# Patient Record
Sex: Female | Born: 1985 | Hispanic: Yes | Marital: Married | Smoking: Never smoker
Health system: Southern US, Community
[De-identification: ages and names within clinical notes are randomized; demographics above are authoritative.]

## PROBLEM LIST (undated history)

## (undated) DIAGNOSIS — S82899A Other fracture of unspecified lower leg, initial encounter for closed fracture: Secondary | ICD-10-CM

## (undated) DIAGNOSIS — N6452 Nipple discharge: Secondary | ICD-10-CM

## (undated) DIAGNOSIS — A749 Chlamydial infection, unspecified: Secondary | ICD-10-CM

## (undated) HISTORY — PX: NO PAST SURGERIES: SHX2092

## (undated) HISTORY — DX: Chlamydial infection, unspecified: A74.9

---

## 2009-03-10 ENCOUNTER — Emergency Department (HOSPITAL_COMMUNITY): Admission: EM | Admit: 2009-03-10 | Discharge: 2009-03-11 | Payer: Self-pay | Admitting: Emergency Medicine

## 2009-08-25 DIAGNOSIS — A749 Chlamydial infection, unspecified: Secondary | ICD-10-CM

## 2009-08-25 HISTORY — DX: Chlamydial infection, unspecified: A74.9

## 2010-12-01 LAB — URINE MICROSCOPIC-ADD ON

## 2010-12-01 LAB — DIFFERENTIAL
Basophils Absolute: 0.1 10*3/uL (ref 0.0–0.1)
Eosinophils Relative: 2 % (ref 0–5)
Lymphocytes Relative: 25 % (ref 12–46)
Lymphs Abs: 1.9 10*3/uL (ref 0.7–4.0)
Neutro Abs: 5 10*3/uL (ref 1.7–7.7)
Neutrophils Relative %: 66 % (ref 43–77)

## 2010-12-01 LAB — URINALYSIS, ROUTINE W REFLEX MICROSCOPIC
Bilirubin Urine: NEGATIVE
Ketones, ur: NEGATIVE mg/dL
Nitrite: NEGATIVE
Urobilinogen, UA: 0.2 mg/dL (ref 0.0–1.0)

## 2010-12-01 LAB — RAPID URINE DRUG SCREEN, HOSP PERFORMED
Barbiturates: NOT DETECTED
Cocaine: NOT DETECTED
Opiates: NOT DETECTED
Tetrahydrocannabinol: NOT DETECTED

## 2010-12-01 LAB — POCT PREGNANCY, URINE: Preg Test, Ur: NEGATIVE

## 2010-12-01 LAB — COMPREHENSIVE METABOLIC PANEL
AST: 27 U/L (ref 0–37)
BUN: 5 mg/dL — ABNORMAL LOW (ref 6–23)
CO2: 26 mEq/L (ref 19–32)
Calcium: 9.8 mg/dL (ref 8.4–10.5)
Chloride: 103 mEq/L (ref 96–112)
Creatinine, Ser: 0.65 mg/dL (ref 0.4–1.2)
GFR calc Af Amer: >60 mL/min (ref >60)
GFR calc non Af Amer: >60 mL/min (ref >60)
Glucose, Bld: 95 mg/dL (ref 70–99)
Total Bilirubin: 0.4 mg/dL (ref 0.3–1.2)

## 2010-12-01 LAB — CBC
HCT: 45.3 % (ref 36.0–46.0)
MCHC: 34.2 g/dL (ref 30.0–36.0)
MCV: 90.3 fL (ref 78.0–100.0)
RBC: 5.02 MIL/uL (ref 3.87–5.11)
WBC: 7.6 10*3/uL (ref 4.0–10.5)

## 2012-08-25 NOTE — L&D Delivery Note (Signed)
Attestation of Attending Supervision of Advanced Practitioner (CNM/NP): Evaluation and management procedures were performed by the Advanced Practitioner under my supervision and collaboration. I have reviewed the Advanced Practitioner's note and chart, and I agree with the management and plan.  Coner Gibbard H. 11:28 AM

## 2012-08-25 NOTE — L&D Delivery Note (Signed)
Delivery Note At 3:56 PM a viable and healthy female was delivered via  (Presentation: LOA;  ).  APGAR: 9, 9; weight pending.   Placenta status: spontaneous, intact .  Cord: 3 vessels with the following complications: none.  Cord pH: pending   Anesthesia: Epidural  Episiotomy: none Lacerations: 2nd degree;Perineal Suture Repair: 3.0 vicryl rapide Est. Blood Loss (mL): 300  Mom to postpartum.  Baby to nursery-stable.  Tawni Carnes 02/25/2013, 4:24 PM

## 2012-09-01 ENCOUNTER — Encounter: Payer: Self-pay | Admitting: *Deleted

## 2012-09-29 ENCOUNTER — Encounter: Payer: Self-pay | Admitting: Obstetrics and Gynecology

## 2012-09-29 ENCOUNTER — Ambulatory Visit (INDEPENDENT_AMBULATORY_CARE_PROVIDER_SITE_OTHER): Payer: Self-pay | Admitting: Obstetrics and Gynecology

## 2012-09-29 VITALS — BP 120/73 | Temp 97.5°F | Ht 63.0 in | Wt 136.0 lb

## 2012-09-29 DIAGNOSIS — Z3480 Encounter for supervision of other normal pregnancy, unspecified trimester: Secondary | ICD-10-CM

## 2012-09-29 DIAGNOSIS — Z3490 Encounter for supervision of normal pregnancy, unspecified, unspecified trimester: Secondary | ICD-10-CM | POA: Insufficient documentation

## 2012-09-29 LAB — POCT URINALYSIS DIP (DEVICE)
Leukocytes, UA: NEGATIVE
Nitrite: NEGATIVE
Protein, ur: NEGATIVE mg/dL
Urobilinogen, UA: 0.2 mg/dL (ref 0.0–1.0)
pH: 6.5 (ref 5.0–8.0)

## 2012-09-29 MED ORDER — MUPIROCIN CALCIUM 2 % EX CREA: TOPICAL_CREAM | Freq: Three times a day (TID) | CUTANEOUS | Status: DC

## 2012-09-29 NOTE — Progress Notes (Signed)
Pulse- 87 

## 2012-09-29 NOTE — Patient Instructions (Addendum)
Por la dolor de los [place], puede usar: acetaminophen 325mg  cada 6 horas. Return in 4 weeks.   Embarazo  Primer trimestre  (Pregnancy - First Trimester)  Durante el acto sexual, millones de espermatozoides entran en la vagina. Slo 1 espermatozoide penetra y fertiliza al vulo mientras se encuentra en la trompa de [place]. Una semana ms tarde, el vulo fertilizado se implanta en la pared del tero. Un embrin comienza a desarrollarse para ser un beb. A las 6 a 8 semanas se forman los ojos y la cara y los latidos del corazn se pueden ver en la ecografa. Al final de las 12 semanas (primer trimestre) todos los rganos del beb estn formados. Ahora que est embarazada, querr hacer todo lo que est a su alcance para tener un beb sano. Dos de las cosas ms importantes son: Winferd Humphrey buena atencin prenatal y seguir las indicaciones del profesional que la asiste. La atencin prenatal incluye toda la asistencia mdica que usted recibe antes del nacimiento del beb. Se lleva a cabo para prevenir y tratar problemas durante el [address] y [place]. EXAMENES PRENATALES   Durante las visitas prenatales se Consolidated Edison, la presin arterial y se solicitan anlisis de [place]. Esto se hace para asegurarse de que usted est sana y el embarazo progrese normalmente.  Una mujer embarazada debe aumentar de 25 a 35 libras durante el embarazo. Sin embargo, si usted tiene sobrepeso o [place], su mdico Administrator, Civil Service.  El podr hacerle preguntas y responder todas las que usted le haga.  Durante los exmenes prenatales se solicitan anlisis de [place], cultivos del [place], un [place] y otros anlisis necesarios. Estas pruebas se realizan para controlar su salud y la del beb. Se recomienda que se haga la prueba para el diagnstico del VIH, con su autorizacin. Este es el virus que causa el [place]. Estas pruebas se realizan porque existen medicamentos que podran administrarle para prevenir que el beb nazca  con esta infeccin, si usted estuviera infectada y no lo supiera. Los [employer] Corporation de sangre tambin se Radiographer, therapeutic para Warehouse manager tipo de [place], si tuvo infecciones previas y Chief Operating Officer sus niveles en la sangre (hemoglobina).  Tener un recuento bajo de hemoglobina (anemia) es comn durante Firefighter. Para prevenirla, se administran hierro y vitaminas. En una etapa ms avanzada del [place], le indicarn exmenes de sangre para saber si tiene diabetes, junto con otros anlisis, en caso de que [place]. Es necesario que se haga las pruebas para asegurarse de que usted y el beb estn bien.  Es posible que necesite otras pruebas adicionales. CAMBIOS DURANTE EL PRIMER TRIMESTRE (LOS TRES PRIMEROS MESES DEL EMBARAZO).  Su organismo atravesar numerosos cambios Academic librarian. Estos pueden variar de Neomia Dear persona a otra. Converse con el mdico acerca los cambios que usted nota y que la preocupan. Ellos son:   El perodo menstrual se detiene.  El vulo y los espermatozoides llevan los genes que determinan cmo seremos. Sus genes y los de su pareja forman el beb. Los genes del varn determinan si ser un nio o una nia.  La circunferencia de la cintura va a ir aumentando y podr sentirse hinchada.  Puede tener Programme researcher, broadcasting/film/video (nuseas) y vmitos. Si no puede controlar los vmitos, consulte a su mdico.  Sus mamas comenzarn a agrandarse y sensibilizarse.  Los pezones pueden sobresalir ms y ser ms oscuros.  Tendr necesidad de orinar ms. El dolor al orinar puede significar que usted tiene una infeccin de la  vejiga.  Se cansar con facilidad.  Prdida del apetito.  Sentir un fuerte deseo de consumir ciertos alimentos.  Al principio, usted puede ganar o perder un par de kilos.  Podr tener cambios emocionales de un da a otro (entusiasmo por estar embarazada o preocupacin por Firefighter y el beb).  Tendr sueos ms vvidos y extraos. INSTRUCCIONES PARA EL CUIDADO EN  EL HOGAR   Es muy importante evitar el cigarrillo, el alcohol y los frmacos no recetados Academic librarian. Estas sustancias afectan la formacin y el desarrollo del beb. Evite los productos qumicos durante el embarazo para Games developer salud del beb.  Comience las consultas prenatales alrededor de la 12 semana de [place]. Generalmente se programan cada mes al principio y se hacen ms frecuentes en los 2 ltimos meses antes del parto. Cumpla con las citas de control. Siga las indicaciones del mdico con respecto al uso de [place], los anlisis y pruebas de [place], los ejercicios y Psychologist, forensic.  Durante el embarazo debe obtener nutrientes para usted y para su beb. Consuma alimentos balanceados. Elija alimentos como carne, pescado, [place] y otros productos lcteos descremados, vegetales, frutas, panes integrales y cereales. El [employer]ot informar cul es el aumento de peso ideal.  Las nuseas matinales pueden aliviarse si come algunas galletitas saladas en la cama. Coma dos galletitas antes de levantarse por la maana. Tambin puede comer galletitas sin sal.  Hacer 4 o 5 comidas pequeas en lugar de 3 comidas grandes por da tambin puede Yahoo nuseas y los vmitos.  Beber lquidos [place] comidas en lugar de tomarlos durante las comidas tambin puede ayudar a Optician, dispensing las nuseas y los vmitos.  Puede continuar teniendo [employer] [place] durante todo el embarazo si no hay otros problemas. Los problemas pueden ser una prdida precoz (prematura) de lquido amnitico, sangrado vaginal, o dolor en el vientre (abdominal).  Realice [employer]oro Corporation, si no tiene restricciones. Consulte con su mdico o terapeuta fsico si no est segura de algunos de sus ejercicios. El mayor aumento de peso se producir en los ltimos 2 trimestres del Psychiatrist. El ejercicio le ayudar a:  Engineering geologist.  Mantenerse en forma.  Prepararse para el parto.  La ayudar a perder el  peso del embarazo despus de que nazca su beb.  Use un buen sostn o como los que se usan para hacer deportes para Paramedic la sensibilidad de las [place]. Tambin puede serle til si lo [place] mientras duerme.  Consulte cuando puede comenzar con las clases de pre parto. Comience con las clases cuando estn disponibles.  No utilice la baera con agua caliente, baos turcos y saunas.  Colquese el cinturn de seguridad cuando conduzca. Este la proteger a usted y al beb en caso de accidente.  Evite comer carne cruda y el contacto con los utensilios y desperdicios de los gatos. Estos elementos contienen grmenes que pueden causar defectos de nacimiento en el beb.  El primer trimestre es un buen momento para visitar a su dentista y Software engineer. Es importante mantener los dientes limpios. Use un cepillo de dientes suave y cepllese con ms suavidad durante el [employer].  Pida ayuda si tienen necesidades financieras, teraputicas o nutricionales. El profesional podr ayudarla con respecto a estas necesidades, o derivarla a otros especialistas.  No tome medicamentos o hierbas excepto aquellos que Fish farm manager.  Informe a su mdico si sufre violencia familiar mental o fsica.  Haga una lista de nmeros de  telfono de emergencia de la familia, los amigos, el hospital y los departamentos de polica y bomberos.  Escriba sus preguntas. Llvelas cuando concurra a su visita prenatal.  No se haga duchas vaginales.  No cruce las piernas.  Si usted tiene que estar parada por largos perodos de [place], gire los pies o de pequeos pasos en crculo.  Es posible que tenga ms secreciones vaginales que puedan requerir una toalla higinica. No use tampones o toallas higinicas perfumadas. EL CONSUMO DE MEDICAMENTOS Y FRMACOS DURANTE EL EMBARAZO   Tome las vitaminas para la etapa prenatal tal como se le indic. Las vitaminas deben contener un miligramo de cido flico. Guarde todas las  vitaminas fuera del alcance de los nios. La ingestin de slo un par de vitaminas o tabletas que contengan hierro pueden ocasionar la [employer] en un beb o en un nio pequeo.  Evite el uso de [employer] Mutual [employer] Omaha, incluyendo hierbas, medicamentos de [place], sin receta o que no hayan sido sugeridos por su mdico. Slo tome medicamentos de venta libre o medicamentos recetados para Chief Technology Officer, Environmental health practitioner o fiebre como lo indique su mdico. No tome aspirina, ibuprofeno o naproxeno excepto que su mdico se lo indique.  Infrmele al profesional si consume medicamentos de hierbas.  El alcohol se relaciona con ciertos defectos congnitos. Incluye el sndrome de alcoholismo fetal. Debe evitar absolutamente el consumo de alcohol, en cualquier forma. El fumar causar baja tasa de natalidad y bebs prematuros.  Las drogas ilegales o de la calle son muy perjudiciales para el beb. Estn absolutamente prohibidas. Un beb que nace de [employer] Express, ser adicto al nacer. Ese beb tendr los mismos sntomas de abstinencia que un adulto.  Informe a su mdico acerca de los medicamentos que ha tomado y el motivo por el que los tom. EL ABORTO ESPONTNEO ES COMN DURANTE EL EMBARAZO  Esto no significa que haya hecho algo mal. No es un motivo para preocuparse en caso de un nuevo embarazo. El profesional que la asiste la ayudar si tiene preguntas para formular. Si tiene un aborto espontneo podr requerir Futures trader.  SOLICITE ATENCIN MDICA SI:  Tiene preguntas o preocupaciones relacionadas con el embarazo. Es mejor que llame para formular las preguntas si no puede esperar hasta la prxima visita, que sentirse preocupada por ellas.  SOLICITE ATENCIN MDICA DE INMEDIATO SI:   La temperatura oral le sube a ms de 102 F (38.9 C) o lo que su mdico le indique.  Tiene una prdida de lquido por la vagina (canal de parto). Si sospecha una ruptura de las [place], tmese la temperatura y llame al  profesional para informarlo sobre esto.  Observa unas pequeas manchas o una hemorragia vaginal. Notifique al profesional acerca de la cantidad y de cuntos apsitos est utilizando.  Presenta un olor desagradable en la secrecin vaginal y observa un cambio en el color.  Contina con las nuseas y no obtiene alivio de los remedios indicados. Vomita sangre o algo similar a la borra del caf.  Pierde ms de 2 libras (1 Kg) en una semana.  Aumenta ms de 1 Kg en una semana y [address], las manos, los pies o las piernas hinchados.  Aumenta ms de 2,5 Kg en una semana (aunque no tenga las manos, pies, piernas o el rostro hinchados).  Ha estado expuesta a la rubola y no ha sufrido la enfermedad.  Ha estado expuesta a la quinta enfermedad o a la varicela.  Siente dolor en el  vientre (abdominal). Las [employer]ul en el ligamento redondo son Neomia Dear causa benigna frecuente de dolor abdominal durante el embarazo. El profesional que la asiste deber evaluarla.  Presenta dolor de Renne Musca, diarrea, dolor al orinar o le falta la respiracin.  Se cae, se ve involucrada en un accidente automovilstico o sufre algn tipo de traumatismo.  En su hogar hay violencia mental o fsica. Document Released: 05/21/2005 Document Revised: 02/10/2012 [facility] Patient Information 2013 [place], [place]. Embarazo - Segundo trimestre (Pregnancy - Second Trimester) El segundo trimestre del Psychiatrist (del 3 al ) es un perodo de evolucin rpida para usted y el beb. Hacia el final del sexto mes, el beb mide aproximadamente 23 cm y pesa 680 g. Comenzar a Pharmacologist del beb [place] 18 y las 20 [address] de [place]. Podr sentir las pataditas ("quickening en ingls"). Hay un rpido [employer]. Puede segregar un lquido claro Charity fundraiser) de las [place]. Quizs sienta pequeas contracciones en el vientre (tero) Esto se conoce como falso trabajo de parto o contracciones de [place]. Es como una  prctica del trabajo de parto que se produce cuando el beb est listo para salir. Generalmente los problemas de vmitos matinales ya se han superado hacia el final del Medical sales representative. Algunas mujeres desarrollan pequeas manchas oscuras (que se denominan cloasma, mscara del embarazo) en la cara que normalmente se van luego del nacimiento del beb. La exposicin al sol empeora las manchas. Puede desarrollarse acn en algunas mujeres embarazadas, y puede desaparecer en aquellas que ya tienen acn. EXAMENES PRENATALES  Durante los [employer]power [employer], deber seguir realizando pruebas de [place], segn avance el [place]. Estas pruebas se realizan para controlar su salud y la del beb. Tambin se realizan anlisis de sangre para The [employer] Mutual niveles de [place]. La anemia (bajo nivel de hemoglobina) es frecuente durante el embarazo. Para prevenirla, se administran hierro y vitaminas. Tambin se le realizarn exmenes para saber si tiene diabetes entre las 24 y las 28 semanas del [place]. Podrn repetirle algunas de las Hovnan[employer] Enterprises hicieron previamente.  En cada visita le medirn el tamao del tero. Esto se realiza para asegurarse de que el beb est creciendo correctamente de acuerdo al estado del [place].  Tambin en cada visita prenatal controlarn su presin arterial. Esto se realiza para asegurarse de que no tenga toxemia.  Se controlar su orina para asegurarse de que no tenga infecciones, diabetes o protena en la orina.  Se controlar su peso regularmente para asegurarse que el aumento ocurre al ritmo indicado. Esto se hace para asegurarse que usted y el beb tienen una evolucin normal.  En algunas ocasiones se realiza una prueba de ultrasonido para confirmar el correcto desarrollo y evolucin del beb. Esta prueba se realiza con ondas sonoras inofensivas para el beb, de modo que el profesional pueda calcular ms precisamente la fecha del [place]. Algunas veces se realizan pruebas  especializadas del lquido amnitico que rodea al beb. Esta prueba se denomina amniocentesis. El lquido amnitico se obtiene introduciendo una aguja en el vientre (abdomen). Se realiza para Conservator, museum/gallery en los que existe alguna preocupacin acerca de algn problema gentico que pueda sufrir el beb. En ocasiones se lleva a cabo cerca del final del embarazo, si es necesario inducir al Apple Computer. En este caso se realiza para asegurarse que los pulmones del beb estn lo suficientemente maduros como para que pueda vivir fuera del tero. CAMBIOS QUE OCURREN EN EL SEGUNDO TRIMESTRE DEL EMBARAZO Su organismo atravesar numerosos cambios  durante el embarazo. Estos pueden variar de Neomia Dear persona a otra. Converse con el profesional que la asiste acerca los cambios que usted note y que la preocupen.  Durante el segundo trimestre probablemente sienta un aumento del apetito. Es normal tener "antojos" de Development worker, community. Esto vara de Neomia Dear persona a otra y de un embarazo a Therapist, art.  El abdomen inferior comenzar a abultarse.  Podr tener la necesidad de Geographical information systems officer con ms frecuencia debido a que el tero y el beb presionan sobre la vejiga. Tambin es frecuente contraer ms infecciones urinarias durante el embarazo (dolor al Conoco[employer]illips). Puede evitarlas bebiendo gran cantidad de lquidos y vaciando la vejiga antes y despus de Sales promotion account executive.  Podrn aparecer las primeras estras en las caderas, abdomen y [place]. Estos son cambios normales del cuerpo durante el [place]. No existen medicamentos ni ejercicios que puedan prevenir [employer]Max.  Es posible que comience a desarrollar venas inflamadas y abultadas (varices) en las piernas. El uso de medias de descanso, Optometrist sus pies durante 15 minutos, 3 a 4 veces al da y Film/video editor la sal en su dieta ayuda a Journalist, newspaper.  Podr sentir Engineering geologist gstrica a medida que el tero crece y Doctor, general practice. Puede tomar anticidos,  con la autorizacin de su mdico, para Financial planner. Tambin es til ingerir pequeas comidas 4 a 5 veces al Futures trader.  La constipacin puede tratarse con un laxante o agregando fibra a su dieta. Beber grandes cantidades de lquidos, comer vegetales, frutas y granos integrales es de [place].  Tambin es beneficioso practicar actividad fsica. Si ha sido una persona Engineer, mining, podr continuar con la Harley[employer]son de las actividades durante el mismo. Si ha sido [employer] Family Insurance, puede ser beneficioso que comience con un programa de ejercicios, Museum/gallery exhibitions officer.  Puede desarrollar hemorroides (vrices en el recto) hacia el final del segundo trimestre. Tomar baos de asiento tibios y Chemical engineer cremas recomendadas por el profesional que lo asiste sern de ayuda para los problemas de hemorroides.  Tambin podr Financial risk analyst de espalda durante este momento de su embarazo. Evite levantar objetos pesados, utilice zapatos de taco bajo y [place] buena postura para ayudar a reducir los problemas de [place].  Algunas mujeres embarazadas desarrollan hormigueo y adormecimiento de la mano y los dedos debido a la hinchazn y compresin de los ligamentos de la mueca (sndrome del tnel carpiano). Esto desaparece una vez que el beb nace.  Como sus pechos se agrandan, Pension scheme manager un sujetador ms grande. Use un sostn de soporte, cmodo y de algodn. No utilice un sostn para amamantar hasta el ltimo mes de embarazo si va a amamantar al beb.  Podr observar una lnea oscura desde el ombligo hacia la zona pbica denominada linea nigra.  Podr observar que sus mejillas se ponen coloradas debido al aumento de flujo sanguneo en la cara.  Podr desarrollar "araitas" en la cara, cuello y pecho. Esto desaparece una vez que el beb nace. INSTRUCCIONES PARA EL CUIDADO DOMICILIARIO  Es extremadamente importante que evite el cigarrillo, hierbas medicinales, alcohol y las drogas no prescriptas  durante el Psychiatrist. Estas sustancias qumicas afectan la formacin y el desarrollo del beb. Evite estas sustancias durante todo el embarazo para asegurar el nacimiento de un beb sano.  La mayor parte de los cuidados que se aconsejan son los mismos que los indicados para Financial risk analyst trimestre del Psychiatrist. Cumpla con las citas tal como se le indic. Siga las instrucciones del profesional que lo  asiste con respecto al [employer], el ejercicio y la dieta.  Durante el embarazo debe obtener nutrientes para usted y para su beb. Consuma alimentos balanceados a intervalos regulares. Elija alimentos como carne, pescado, [place] y otros productos lcteos descremados, vegetales, frutas, panes integrales y cereales. El Equities trader cul es el aumento de peso ideal.  Las relaciones sexuales fsicas pueden continuarse hasta cerca del fin del embarazo si no existen otros problemas. Estos problemas pueden ser la prdida temprana (prematura) de lquido amnitico de las [place], sangrado vaginal, dolor abdominal u otros problemas mdicos o del Psychiatrist.  Realice Tesoro Corporation, si no tiene restricciones. Consulte con el profesional que la asiste si no sabe con certeza si determinados ejercicios son seguros. El mayor aumento de peso tiene Environmental consultant durante los ltimos 2 trimestres del Psychiatrist. El ejercicio la ayudar a:  Engineering geologist.  Ponerla en forma para el parto.  Ayudarla a perder peso luego de haber dado a luz.  Use un buen sostn o como los que se usan para hacer deportes para Paramedic la sensibilidad de las [place]. Tambin puede serle til si lo [place] mientras duerme. Si pierde Product manager, podr Parker Hann[employer]in.  No utilice la baera con agua caliente, baos turcos y saunas durante el [address]  Utilice el cinturn de seguridad sin excepcin cuando conduzca. Este la proteger a usted y al beb en caso de accidente.  Evite comer carne cruda, queso  crudo, y el contacto con los utensilios y desperdicios de los gatos. Estos elementos contienen grmenes que pueden causar defectos de nacimiento en el beb.  El segundo trimestre es un buen momento para visitar a su dentista y Software engineer si an no lo ha hecho. Es Primary school teacher los dientes limpios. Utilice un cepillo de dientes blando. Cepllese ms suavemente durante el embarazo.  Es ms fcil perder algo de orina durante el [place]. Apretar y Chief Operating Officer los msculos de la pelvis la ayudar con este problema. Practique detener la miccin cuando est en el bao. Estos son los mismos msculos que Development worker, international aid. Son [employer] Partners mismos msculos que utiliza cuando trata de Ryder System gases. Puede practicar apretando estos msculos 10 veces, y repetir esto 3 veces por da aproximadamente. Una vez que conozca qu msculos debe apretar, no realice estos ejercicios durante la miccin. Puede favorecerle una infeccin si la orina vuelve hacia atrs.  Pida ayuda si tiene necesidades econmicas, de asesoramiento o nutricionales durante el [place]. El profesional podr ayudarla con respecto a estas necesidades, o derivarla a otros especialistas.  La piel puede ponerse grasa. Si esto sucede, lvese la cara con un jabn [place], utilice un humectante no graso y [place] con base de aceite o crema. CONSUMO DE MEDICAMENTOS Y DROGAS DURANTE EL EMBARAZO  Contine tomando las vitaminas apropiadas para esta etapa tal como se le indic. Las vitaminas deben contener un miligramo de cido flico y deben suplementarse con hierro. Guarde todas las vitaminas fuera del alcance de los nios. La ingestin de slo un par de vitaminas o tabletas que contengan hierro puede ocasionar la [employer]mont Mining en un beb o en un nio pequeo.  Evite el uso de [place], inclusive los de venta libre y hierbas que no hayan sido prescriptos o indicados por el profesional que la asiste. Algunos medicamentos pueden causar  problemas fsicos al beb. Utilice los medicamentos de venta libre o de prescripcin para el dolor, el malestar o la fiebre, segn se lo indique  el profesional que lo asiste. No utilice aspirina.  El consumo de alcohol est relacionado con ciertos defectos de nacimiento. Esto incluye el sndrome de alcoholismo fetal. Debe evitar el consumo de alcohol en cualquiera de sus formas. El cigarrillo causa nacimientos prematuros y bebs de [place]. El uso de drogas recreativas est absolutamente prohibido. Son muy nocivas para el beb. Un beb que nace de American Express, ser adicto al nacer. Ese beb tendr los mismos sntomas de abstinencia que un adulto.  Infrmele al profesional si consume alguna droga.  No consuma drogas ilegales. Pueden causarle mucho dao al beb. SOLICITE ATENCIN MDICA SI: Tiene preguntas o preocupaciones durante su embarazo. Es mejor que llame para Science writer las dudas que esperar hasta su prxima visita prenatal. Thressa Sheller forma se sentir ms tranquila.  SOLICITE ATENCIN MDICA DE INMEDIATO SI:  La temperatura oral se eleva sin motivo por encima de 102 F (38.9 C) o segn le indique el profesional que lo asiste.  Tiene una prdida de lquido por la vagina (canal de parto). Si sospecha una ruptura de las [place], tmese la temperatura y llame al profesional para informarlo sobre esto.  Observa unas pequeas manchas, una hemorragia vaginal o elimina cogulos. Notifique al profesional acerca de la cantidad y de cuntos apsitos est utilizando. Unas pequeas manchas de sangre son algo comn durante el Psychiatrist, especialmente despus de Sales promotion account executive.  Presenta un olor desagradable en la secrecin vaginal y observa un cambio en el color, de transparente a blanco.  Contina con las nuseas y no obtiene alivio de los remedios indicados. Vomita sangre o algo similar a la borra del caf.  [place] o sube ms de 900 g. en una semana, o segn lo indicado por el  profesional que la asiste.  Observa que se le Southwest Airlines, las manos, los pies o las piernas.  Ha estado expuesta a la rubola y no ha sufrido la enfermedad.  Ha estado expuesta a la quinta enfermedad o a la varicela.  Presenta dolor abdominal. Las molestias en el ligamento redondo son Neomia Dear causa no cancerosa (benigna) frecuente de dolor abdominal durante el embarazo. El profesional que la asiste deber evaluarla.  Presenta dolor de cabeza intenso que no se [place].  Presenta fiebre, diarrea, dolor al orinar o le falta la respiracin.  Presenta dificultad para ver, visin borrosa, o visin doble.  Sufre una cada, un accidente de trnsito o cualquier tipo de trauma.  Vive en un hogar en el que existe violencia fsica o mental. Document Released: 05/21/2005 Document Revised: 11/03/2011 [facility] Patient Information 2013 [place], [place].

## 2012-09-29 NOTE — Progress Notes (Signed)
1st pregnancy and initial OB visit.  G1P0 Doing well.  Did eat raw oysters and had subsequent diarrhea and diffuse abdominal pain.  This is now resolved.  Patient also reports pain in her hands and feet.   Denies vaginal bleeding and loss of fluid. Does report some intermittent diffuse abdominal pain.

## 2012-09-29 NOTE — Progress Notes (Signed)
  Subjective:    Amber Thomas is being seen today for her first obstetrical visit.  This is a planned pregnancy. She is at [redacted]w[redacted]d gestation. Her obstetrical history is significant for no risk factors. Relationship with FOB: spouse, living together. Patient does intend to breast feed. Pregnancy history fully reviewed.  Patient reports: diarrhea and diffuse abdominal pain after eating raw oysters a few days ago. Now resolved. Reporting some pain in hands and feet. Has not tried anything for it. She also reports some abdominal discomfort, worst at night, feels like tightening in the upper abdomen. Resolves during the day. No nausea, no vomiting. She also reports some itchy skin around pubic bone associated with rash. She also reports itching on back and abdomen, without a rash. No vaginal bleeding, no abnormal discharge or loss of fluid.   Review of Systems:   Review of Systems NEgative except per HPI Objective:     BP 120/73  Temp 97.5 F (36.4 C)  Ht 5\' 3"  (1.6 m)  Wt 136 lb (61.689 kg)  BMI 24.09 kg/m2  LMP 05/25/2012 Physical Exam  Exam Physical Examination: General appearance - alert, well appearing, and in no distress Chest - clear to auscultation, no wheezes, rales or rhonchi, symmetric air entry Heart - normal rate, regular rhythm, normal S1, S2, no murmurs, rubs, clicks or gallops Abdomen - gravid, non tender Pelvic - normal external genitalia, vulva and vagina. Friable cervix with pap smear.  Cervix: closed and thick.  Skin - erythematous papules around shaved pubic hair.    Assessment:    Pregnancy: 27 yo G1P0 at 18.1wga who presents for initial OB visit.  Patient Active Problem List  Diagnosis  . Supervision of normal pregnancy       Plan:     - Initial labs drawn. Anatomy US ordered - No risk factors for early glucola (no first degree relative h/o DM2, normal BMI) - Prenatal vitamins. - folliculitis: instructed to stop shaving. Will also send  mupirocin. - abdominal discomfort: likely from food poisoning. Cervix closed.  - abdominal and back itching: unlikely to be from cholestasis in pregnancy. Follow up at next visit.  Problem list reviewed and updated. - Quad screen: offered and drawn today.  Follow up in 4 weeks.   Amber Thomas 09/29/2012

## 2012-09-29 NOTE — Progress Notes (Signed)
Evaluation and management procedures were performed by Resident physician under my supervision/collaboration. Chart reviewed, patient examined by me and I agree with management and plan.  

## 2012-09-30 LAB — OBSTETRIC PANEL
Eosinophils Absolute: 0.1 10*3/uL (ref 0.0–0.7)
Hemoglobin: 12.3 g/dL (ref 12.0–15.0)
Hepatitis B Surface Ag: NEGATIVE
Lymphocytes Relative: 18 % (ref 12–46)
Lymphs Abs: 1.9 10*3/uL (ref 0.7–4.0)
MCH: 29.9 pg (ref 26.0–34.0)
Monocytes Relative: 4 % (ref 3–12)
Neutrophils Relative %: 76 % (ref 43–77)
Platelets: 302 10*3/uL (ref 150–400)
RBC: 4.12 MIL/uL (ref 3.87–5.11)
Rh Type: POSITIVE
Rubella: 8.1 Index — ABNORMAL HIGH (ref <0.90)
WBC: 10.2 10*3/uL (ref 4.0–10.5)

## 2012-10-02 LAB — CULTURE, OB URINE: Colony Count: >=100000

## 2012-10-04 ENCOUNTER — Telehealth: Payer: Self-pay | Admitting: *Deleted

## 2012-10-04 DIAGNOSIS — N390 Urinary tract infection, site not specified: Secondary | ICD-10-CM

## 2012-10-04 MED ORDER — CEPHALEXIN 500 MG PO CAPS: 500.0000 mg | ORAL_CAPSULE | Freq: Two times a day (BID) | ORAL | Status: DC

## 2012-10-04 NOTE — Telephone Encounter (Signed)
Message copied by Gerome Apley on Mon Oct 04, 2012  4:40 PM ------      Message from: POE, DEIRDRE C      Created: Mon Oct 04, 2012  1:03 PM       Please tx keflex 500 bid if not already done ------

## 2012-10-04 NOTE — Telephone Encounter (Signed)
Called patient with ana for interpreter and informed patient of UTI and that a medication had been sent to her [facility] pharmacy and is ready for pickup. Patient verbalized understanding and had no further questions

## 2012-10-05 ENCOUNTER — Other Ambulatory Visit: Payer: Self-pay | Admitting: *Deleted

## 2012-10-05 ENCOUNTER — Telehealth: Payer: Self-pay | Admitting: *Deleted

## 2012-10-05 DIAGNOSIS — N390 Urinary tract infection, site not specified: Secondary | ICD-10-CM

## 2012-10-05 MED ORDER — CEPHALEXIN 500 MG PO CAPS: 500.0000 mg | ORAL_CAPSULE | Freq: Two times a day (BID) | ORAL | Status: DC

## 2012-10-05 NOTE — Telephone Encounter (Signed)
Pt called with questions about medications which were ordered. [employer] interpreter was assisting with pt call. Pt stated that she was told she would have antibiotic pills ordered but when she went to the pharmacy, the only Rx they had on file was for a cream. I explained to pt that her antibiotic Rx will be re-sent to the pharmacy and she can pick it up later today. (Rx had been printed and not given to pt)  I also informed her that she needs the cream to apply to the areas of infection (folliculitis) from shaving. This was discussed at the time of her visit on 09/29/12. Pt also asked for her lab results from visit on 09/29/12. I stated that all other results were normal (pt already aware of UTI). Pt voiced understanding and had no further questions.

## 2012-10-06 ENCOUNTER — Encounter: Payer: Self-pay | Admitting: Family

## 2012-10-18 ENCOUNTER — Encounter (HOSPITAL_COMMUNITY): Payer: Self-pay

## 2012-10-18 ENCOUNTER — Inpatient Hospital Stay (HOSPITAL_COMMUNITY)
Admission: AD | Admit: 2012-10-18 | Discharge: 2012-10-18 | Disposition: A | Discharge status: Home / Self Care | Payer: Self-pay | Source: Ambulatory Visit | Attending: Obstetrics & Gynecology | Admitting: Obstetrics & Gynecology

## 2012-10-18 DIAGNOSIS — O368190 Decreased fetal movements, unspecified trimester, not applicable or unspecified: Secondary | ICD-10-CM | POA: Insufficient documentation

## 2012-10-18 NOTE — MAU Note (Signed)
Has had one prenatal visit in our OB clinic and is scheduled for u/s on3/5/14; denies any pain or bleeding; concerned about feeling less fetal movement today; G1; 20 weeks and 6 days gestation;

## 2012-10-18 NOTE — MAU Provider Note (Signed)
Chief Complaint: Decreased Fetal Movement    SUBJECTIVE HPI: Amber Thomas is a 27 y.o. G1P0 at [redacted]w[redacted]d by LMP who presents with concern of not feeling FM today though she had been feeling it daily. While here, began to perceive movement. PN course: LRC, uncomplicated  Past Medical History  Diagnosis Date  . Medical history non-contributory    OB History   Grav Para Term Preterm Abortions TAB SAB Ect Mult Living   1              # Outc Date GA Lbr Len/2nd Wgt Sex Del Anes PTL Lv   1 CUR              Past Surgical History  Procedure Laterality Date  . No past surgeries     History   Social History  . Marital Status: Single    Spouse Name: N/A    Number of Children: N/A  . Years of Education: N/A   Occupational History  . Not on file.   Social History Main Topics  . Smoking status: Never Smoker   . Smokeless tobacco: Not on file  . Alcohol Use: No  . Drug Use: No  . Sexually Active: Not on file   Other Topics Concern  . Not on file   Social History Narrative  . No narrative on file   No current facility-administered medications on file prior to encounter.   No current outpatient prescriptions on file prior to encounter.   No Known Allergies  ROS: Pertinent items in HPI  OBJECTIVE Blood pressure 123/69, pulse 85, temperature 98.4 F (36.9 C), temperature source Oral, resp. rate 16, height 5\' 5"  (1.651 m), weight 62.234 kg (137 lb 3.2 oz), SpO2 100.00%. GENERAL: Well-developed, well-nourished female in no acute distress.  HEENT: Normocephalic HEART: normal rate RESP: normal effort ABDOMEN: Soft, non-tender, DT FHR 150 EXTREMITIES: Nontender, no edema NEURO: Alert and oriented ASSESSMENT 1. Decreased fetal movement in pregnancy, delivered, second trimester, fetus 1   G1 at [redacted]w[redacted]d   PLAN Discharge home with reassurance. See AVS Follow-up Information   Follow up with [facility] On 10/27/2012.       Medication List     TAKE these medications       prenatal multivitamin Tabs  Take 1 tablet by mouth daily.         Danae Orleans, CNM 10/18/2012  12:45 PM

## 2012-10-18 NOTE — MAU Note (Signed)
Patient states she has felt fetal movement since last week. Has only one visit to the [facility]. Has had loose stool for one week. No pain or bleeding.

## 2012-10-21 ENCOUNTER — Encounter (HOSPITAL_COMMUNITY): Payer: Self-pay

## 2012-10-24 ENCOUNTER — Encounter: Payer: Self-pay | Admitting: Family

## 2012-10-27 ENCOUNTER — Ambulatory Visit (INDEPENDENT_AMBULATORY_CARE_PROVIDER_SITE_OTHER): Payer: Self-pay | Admitting: Obstetrics and Gynecology

## 2012-10-27 ENCOUNTER — Encounter: Payer: Self-pay | Admitting: Obstetrics and Gynecology

## 2012-10-27 VITALS — BP 97/65 | Temp 97.3°F | Wt 138.0 lb

## 2012-10-27 DIAGNOSIS — Z3400 Encounter for supervision of normal first pregnancy, unspecified trimester: Secondary | ICD-10-CM

## 2012-10-27 DIAGNOSIS — Z3402 Encounter for supervision of normal first pregnancy, second trimester: Secondary | ICD-10-CM

## 2012-10-27 NOTE — Patient Instructions (Signed)
Embarazo - Segundo trimestre (Pregnancy - Second Trimester) El segundo trimestre del embarazo (del 3 al 6mes) es un perodo de evolucin rpida para usted y el beb. Hacia el final del sexto mes, el beb mide aproximadamente 23 cm y pesa 680 g. Comenzar a sentir los movimientos del beb entre las 18 y las 20 semanas de embarazo. Podr sentir las pataditas ("quickening en ingls"). Hay un rpido aumento de peso. Puede segregar un lquido claro (calostro) de las mamas. Quizs sienta pequeas contracciones en el vientre (tero) Esto se conoce como falso trabajo de parto o contracciones de Braxton-Hicks. Es como una prctica del trabajo de parto que se produce cuando el beb est listo para salir. Generalmente los problemas de vmitos matinales ya se han superado hacia el final del primer trimestre. Algunas mujeres desarrollan pequeas manchas oscuras (que se denominan cloasma, mscara del embarazo) en la cara que normalmente se van luego del nacimiento del beb. La exposicin al sol empeora las manchas. Puede desarrollarse acn en algunas mujeres embarazadas, y puede desaparecer en aquellas que ya tienen acn. EXAMENES PRENATALES  Durante los exmenes prenatales, deber seguir realizando pruebas de sangre, segn avance el embarazo. Estas pruebas se realizan para controlar su salud y la del beb. Tambin se realizan anlisis de sangre para conocer los niveles de hemoglobina. La anemia (bajo nivel de hemoglobina) es frecuente durante el embarazo. Para prevenirla, se administran hierro y vitaminas. Tambin se le realizarn exmenes para saber si tiene diabetes entre las 24 y las 28 semanas del embarazo. Podrn repetirle algunas de las pruebas que le hicieron previamente.  En cada visita le medirn el tamao del tero. Esto se realiza para asegurarse de que el beb est creciendo correctamente de acuerdo al estado del embarazo.  Tambin en cada visita prenatal controlarn su presin arterial. Esto se realiza  para asegurarse de que no tenga toxemia.  Se controlar su orina para asegurarse de que no tenga infecciones, diabetes o protena en la orina.  Se controlar su peso regularmente para asegurarse que el aumento ocurre al ritmo indicado. Esto se hace para asegurarse que usted y el beb tienen una evolucin normal.  En algunas ocasiones se realiza una prueba de ultrasonido para confirmar el correcto desarrollo y evolucin del beb. Esta prueba se realiza con ondas sonoras inofensivas para el beb, de modo que el profesional pueda calcular ms precisamente la fecha del parto. Algunas veces se realizan pruebas especializadas del lquido amnitico que rodea al beb. Esta prueba se denomina amniocentesis. El lquido amnitico se obtiene introduciendo una aguja en el vientre (abdomen). Se realiza para controlar los cromosomas en aquellos casos en los que existe alguna preocupacin acerca de algn problema gentico que pueda sufrir el beb. En ocasiones se lleva a cabo cerca del final del embarazo, si es necesario inducir al parto. En este caso se realiza para asegurarse que los pulmones del beb estn lo suficientemente maduros como para que pueda vivir fuera del tero. CAMBIOS QUE OCURREN EN EL SEGUNDO TRIMESTRE DEL EMBARAZO Su organismo atravesar numerosos cambios durante el embarazo. Estos pueden variar de una persona a otra. Converse con el profesional que la asiste acerca los cambios que usted note y que la preocupen.  Durante el segundo trimestre probablemente sienta un aumento del apetito. Es normal tener "antojos" de ciertas comidas. Esto vara de una persona a otra y de un embarazo a otro.  El abdomen inferior comenzar a abultarse.  Podr tener la necesidad de orinar con ms frecuencia debido a que   el tero y el beb presionan sobre la vejiga. Tambin es frecuente contraer ms infecciones urinarias durante el embarazo (dolor al orinar). Puede evitarlas bebiendo gran cantidad de lquidos y vaciando  la vejiga antes y despus de mantener relaciones sexuales.  Podrn aparecer las primeras estras en las caderas, abdomen y mamas. Estos son cambios normales del cuerpo durante el embarazo. No existen medicamentos ni ejercicios que puedan prevenir estos cambios.  Es posible que comience a desarrollar venas inflamadas y abultadas (varices) en las piernas. El uso de medias de descanso, elevar sus pies durante 15 minutos, 3 a 4 veces al da y limitar la sal en su dieta ayuda a aliviar el problema.  Podr sentir acidez gstrica a medida que el tero crece y presiona contra el estmago. Puede tomar anticidos, con la autorizacin de su mdico, para aliviar este problema. Tambin es til ingerir pequeas comidas 4 a 5 veces al da.  La constipacin puede tratarse con un laxante o agregando fibra a su dieta. Beber grandes cantidades de lquidos, comer vegetales, frutas y granos integrales es de gran ayuda.  Tambin es beneficioso practicar actividad fsica. Si ha sido una persona activa hasta el embarazo, podr continuar con la mayora de las actividades durante el mismo. Si ha sido menos activa, puede ser beneficioso que comience con un programa de ejercicios, como realizar caminatas.  Puede desarrollar hemorroides (vrices en el recto) hacia el final del segundo trimestre. Tomar baos de asiento tibios y utilizar cremas recomendadas por el profesional que lo asiste sern de ayuda para los problemas de hemorroides.  Tambin podr sentir dolor de espalda durante este momento de su embarazo. Evite levantar objetos pesados, utilice zapatos de taco bajo y mantenga una buena postura para ayudar a reducir los problemas de espalda.  Algunas mujeres embarazadas desarrollan hormigueo y adormecimiento de la mano y los dedos debido a la hinchazn y compresin de los ligamentos de la mueca (sndrome del tnel carpiano). Esto desaparece una vez que el beb nace.  Como sus pechos se agrandan, necesitar un sujetador  ms grande. Use un sostn de soporte, cmodo y de algodn. No utilice un sostn para amamantar hasta el ltimo mes de embarazo si va a amamantar al beb.  Podr observar una lnea oscura desde el ombligo hacia la zona pbica denominada linea nigra.  Podr observar que sus mejillas se ponen coloradas debido al aumento de flujo sanguneo en la cara.  Podr desarrollar "araitas" en la cara, cuello y pecho. Esto desaparece una vez que el beb nace. INSTRUCCIONES PARA EL CUIDADO DOMICILIARIO  Es extremadamente importante que evite el cigarrillo, hierbas medicinales, alcohol y las drogas no prescriptas durante el embarazo. Estas sustancias qumicas afectan la formacin y el desarrollo del beb. Evite estas sustancias durante todo el embarazo para asegurar el nacimiento de un beb sano.  La mayor parte de los cuidados que se aconsejan son los mismos que los indicados para el primer trimestre del embarazo. Cumpla con las citas tal como se le indic. Siga las instrucciones del profesional que lo asiste con respecto al uso de los medicamentos, el ejercicio y la dieta.  Durante el embarazo debe obtener nutrientes para usted y para su beb. Consuma alimentos balanceados a intervalos regulares. Elija alimentos como carne, pescado, leche y otros productos lcteos descremados, vegetales, frutas, panes integrales y cereales. El profesional le informar cul es el aumento de peso ideal.  Las relaciones sexuales fsicas pueden continuarse hasta cerca del fin del embarazo si no existen otros problemas. Estos   problemas pueden ser la prdida temprana (prematura) de lquido amnitico de las membranas, sangrado vaginal, dolor abdominal u otros problemas mdicos o del embarazo.  Realice actividad fsica todos los das, si no tiene restricciones. Consulte con el profesional que la asiste si no sabe con certeza si determinados ejercicios son seguros. El mayor aumento de peso tiene lugar durante los ltimos 2 trimestres del  embarazo. El ejercicio la ayudar a:  Controlar su peso.  Ponerla en forma para el parto.  Ayudarla a perder peso luego de haber dado a luz.  Use un buen sostn o como los que se usan para hacer deportes para aliviar la sensibilidad de las mamas. Tambin puede serle til si lo usa mientras duerme. Si pierde calostro, podr utilizar apsitos en el sostn.  No utilice la baera con agua caliente, baos turcos y saunas durante el embarazo.  Utilice el cinturn de seguridad sin excepcin cuando conduzca. Este la proteger a usted y al beb en caso de accidente.  Evite comer carne cruda, queso crudo, y el contacto con los utensilios y desperdicios de los gatos. Estos elementos contienen grmenes que pueden causar defectos de nacimiento en el beb.  El segundo trimestre es un buen momento para visitar a su dentista y evaluar su salud dental si an no lo ha hecho. Es importante mantener los dientes limpios. Utilice un cepillo de dientes blando. Cepllese ms suavemente durante el embarazo.  Es ms fcil perder algo de orina durante el embarazo. Apretar y fortalecer los msculos de la pelvis la ayudar con este problema. Practique detener la miccin cuando est en el bao. Estos son los mismos msculos que necesita fortalecer. Son tambin los mismos msculos que utiliza cuando trata de evitar los gases. Puede practicar apretando estos msculos 10 veces, y repetir esto 3 veces por da aproximadamente. Una vez que conozca qu msculos debe apretar, no realice estos ejercicios durante la miccin. Puede favorecerle una infeccin si la orina vuelve hacia atrs.  Pida ayuda si tiene necesidades econmicas, de asesoramiento o nutricionales durante el embarazo. El profesional podr ayudarla con respecto a estas necesidades, o derivarla a otros especialistas.  La piel puede ponerse grasa. Si esto sucede, lvese la cara con un jabn suave, utilice un humectante no graso y maquillaje con base de aceite o  crema. CONSUMO DE MEDICAMENTOS Y DROGAS DURANTE EL EMBARAZO  Contine tomando las vitaminas apropiadas para esta etapa tal como se le indic. Las vitaminas deben contener un miligramo de cido flico y deben suplementarse con hierro. Guarde todas las vitaminas fuera del alcance de los nios. La ingestin de slo un par de vitaminas o tabletas que contengan hierro puede ocasionar la muerte en un beb o en un nio pequeo.  Evite el uso de medicamentos, inclusive los de venta libre y hierbas que no hayan sido prescriptos o indicados por el profesional que la asiste. Algunos medicamentos pueden causar problemas fsicos al beb. Utilice los medicamentos de venta libre o de prescripcin para el dolor, el malestar o la fiebre, segn se lo indique el profesional que lo asiste. No utilice aspirina.  El consumo de alcohol est relacionado con ciertos defectos de nacimiento. Esto incluye el sndrome de alcoholismo fetal. Debe evitar el consumo de alcohol en cualquiera de sus formas. El cigarrillo causa nacimientos prematuros y bebs de bajo peso. El uso de drogas recreativas est absolutamente prohibido. Son muy nocivas para el beb. Un beb que nace de una madre adicta, ser adicto al nacer. Ese beb tendr los mismos   sntomas de abstinencia que un adulto.  Infrmele al profesional si consume alguna droga.  No consuma drogas ilegales. Pueden causarle mucho dao al beb. SOLICITE ATENCIN MDICA SI: Tiene preguntas o preocupaciones durante su embarazo. Es mejor que llame para consultar las dudas que esperar hasta su prxima visita prenatal. De esta forma se sentir ms tranquila.  SOLICITE ATENCIN MDICA DE INMEDIATO SI:  La temperatura oral se eleva sin motivo por encima de 102 F (38.9 C) o segn le indique el profesional que lo asiste.  Tiene una prdida de lquido por la vagina (canal de parto). Si sospecha una ruptura de las membranas, tmese la temperatura y llame al profesional para informarlo sobre  esto.  Observa unas pequeas manchas, una hemorragia vaginal o elimina cogulos. Notifique al profesional acerca de la cantidad y de cuntos apsitos est utilizando. Unas pequeas manchas de sangre son algo comn durante el embarazo, especialmente despus de mantener relaciones sexuales.  Presenta un olor desagradable en la secrecin vaginal y observa un cambio en el color, de transparente a blanco.  Contina con las nuseas y no obtiene alivio de los remedios indicados. Vomita sangre o algo similar a la borra del caf.  [place] o sube ms de 900 g. en una semana, o segn lo indicado por el profesional que la asiste.  Observa que se le hinchan el rostro, las manos, los pies o las piernas.  Ha estado expuesta a la rubola y no ha sufrido la enfermedad.  Ha estado expuesta a la quinta enfermedad o a la varicela.  Presenta dolor abdominal. Las molestias en el ligamento redondo son una causa no cancerosa (benigna) frecuente de dolor abdominal durante el embarazo. El profesional que la asiste deber evaluarla.  Presenta dolor de cabeza intenso que no se alivia.  Presenta fiebre, diarrea, dolor al orinar o le falta la respiracin.  Presenta dificultad para ver, visin borrosa, o visin doble.  Sufre una cada, un accidente de trnsito o cualquier tipo de trauma.  Vive en un hogar en el que existe violencia fsica o mental. Document Released: 05/21/2005 Document Revised: 11/03/2011 [employer] Patient Information 2013 [employer].  

## 2012-10-27 NOTE — Progress Notes (Signed)
Pulse: 88

## 2012-10-27 NOTE — Progress Notes (Signed)
Doing well. Has not had anatomic scan that was scheduled for this am> to [place]. ASB was treated.

## 2012-11-01 ENCOUNTER — Ambulatory Visit (HOSPITAL_COMMUNITY)
Admission: RE | Admit: 2012-11-01 | Discharge: 2012-11-01 | Disposition: A | Discharge status: Home / Self Care | Payer: Self-pay | Source: Ambulatory Visit | Attending: Family | Admitting: Family

## 2012-11-01 DIAGNOSIS — Z363 Encounter for antenatal screening for malformations: Secondary | ICD-10-CM | POA: Insufficient documentation

## 2012-11-01 DIAGNOSIS — Z1389 Encounter for screening for other disorder: Secondary | ICD-10-CM | POA: Insufficient documentation

## 2012-11-01 DIAGNOSIS — Z3490 Encounter for supervision of normal pregnancy, unspecified, unspecified trimester: Secondary | ICD-10-CM

## 2012-11-01 DIAGNOSIS — O358XX0 Maternal care for other (suspected) fetal abnormality and damage, not applicable or unspecified: Secondary | ICD-10-CM | POA: Insufficient documentation

## 2012-11-02 ENCOUNTER — Telehealth: Payer: Self-pay | Admitting: *Deleted

## 2012-11-02 DIAGNOSIS — B379 Candidiasis, unspecified: Secondary | ICD-10-CM

## 2012-11-02 MED ORDER — FLUCONAZOLE 150 MG PO TABS: 150.0000 mg | ORAL_TABLET | Freq: Once | ORAL | Status: DC

## 2012-11-02 NOTE — Telephone Encounter (Signed)
Patient called and stated that she has a white discharge with itching. Per OB Protocol Diflucan ordered and sent to her pharmacy. Pt voiced understanding.

## 2012-11-04 ENCOUNTER — Encounter: Payer: Self-pay | Admitting: Family

## 2012-11-24 ENCOUNTER — Ambulatory Visit (INDEPENDENT_AMBULATORY_CARE_PROVIDER_SITE_OTHER): Payer: Self-pay | Admitting: Obstetrics and Gynecology

## 2012-11-24 ENCOUNTER — Other Ambulatory Visit: Payer: Self-pay | Admitting: Obstetrics and Gynecology

## 2012-11-24 VITALS — BP 111/71 | Temp 98.6°F | Wt 143.2 lb

## 2012-11-24 DIAGNOSIS — Z3400 Encounter for supervision of normal first pregnancy, unspecified trimester: Secondary | ICD-10-CM

## 2012-11-24 DIAGNOSIS — Z3402 Encounter for supervision of normal first pregnancy, second trimester: Secondary | ICD-10-CM

## 2012-11-24 LAB — CBC
HCT: 34.3 % — ABNORMAL LOW (ref 36.0–46.0)
Hemoglobin: 11.9 g/dL — ABNORMAL LOW (ref 12.0–15.0)
MCHC: 34.7 g/dL (ref 30.0–36.0)
WBC: 11.1 10*3/uL — ABNORMAL HIGH (ref 4.0–10.5)

## 2012-11-24 LAB — POCT URINALYSIS DIP (DEVICE)
Bilirubin Urine: NEGATIVE
Glucose, UA: NEGATIVE mg/dL
Hgb urine dipstick: NEGATIVE
Ketones, ur: NEGATIVE mg/dL
Specific Gravity, Urine: 1.015 (ref 1.005–1.030)
Urobilinogen, UA: 0.2 mg/dL (ref 0.0–1.0)

## 2012-11-24 NOTE — Addendum Note (Signed)
Addended by: Franchot Mimes on: 11/24/2012 03:40 PM   Modules accepted: Orders

## 2012-11-24 NOTE — Progress Notes (Signed)
Doing well. 1 hr glucola today. Anatomic [place] all normal but spine and outflow tracts not well visualized>F/U scheduled.

## 2012-11-24 NOTE — Patient Instructions (Signed)
Embarazo - Segundo trimestre (Pregnancy - Second Trimester) El segundo trimestre del embarazo (del 3 al 6mes) es un perodo de evolucin rpida para usted y el beb. Hacia el final del sexto mes, el beb mide aproximadamente 23 cm y pesa 680 g. Comenzar a sentir los movimientos del beb entre las 18 y las 20 semanas de embarazo. Podr sentir las pataditas ("quickening en ingls"). Hay un rpido aumento de peso. Puede segregar un lquido claro (calostro) de las mamas. Quizs sienta pequeas contracciones en el vientre (tero) Esto se conoce como falso trabajo de parto o contracciones de Braxton-Hicks. Es como una prctica del trabajo de parto que se produce cuando el beb est listo para salir. Generalmente los problemas de vmitos matinales ya se han superado hacia el final del primer trimestre. Algunas mujeres desarrollan pequeas manchas oscuras (que se denominan cloasma, mscara del embarazo) en la cara que normalmente se van luego del nacimiento del beb. La exposicin al sol empeora las manchas. Puede desarrollarse acn en algunas mujeres embarazadas, y puede desaparecer en aquellas que ya tienen acn. EXAMENES PRENATALES  Durante los exmenes prenatales, deber seguir realizando pruebas de sangre, segn avance el embarazo. Estas pruebas se realizan para controlar su salud y la del beb. Tambin se realizan anlisis de sangre para conocer los niveles de hemoglobina. La anemia (bajo nivel de hemoglobina) es frecuente durante el embarazo. Para prevenirla, se administran hierro y vitaminas. Tambin se le realizarn exmenes para saber si tiene diabetes entre las 24 y las 28 semanas del embarazo. Podrn repetirle algunas de las pruebas que le hicieron previamente.  En cada visita le medirn el tamao del tero. Esto se realiza para asegurarse de que el beb est creciendo correctamente de acuerdo al estado del embarazo.  Tambin en cada visita prenatal controlarn su presin arterial. Esto se realiza  para asegurarse de que no tenga toxemia.  Se controlar su orina para asegurarse de que no tenga infecciones, diabetes o protena en la orina.  Se controlar su peso regularmente para asegurarse que el aumento ocurre al ritmo indicado. Esto se hace para asegurarse que usted y el beb tienen una evolucin normal.  En algunas ocasiones se realiza una prueba de ultrasonido para confirmar el correcto desarrollo y evolucin del beb. Esta prueba se realiza con ondas sonoras inofensivas para el beb, de modo que el profesional pueda calcular ms precisamente la fecha del parto. Algunas veces se realizan pruebas especializadas del lquido amnitico que rodea al beb. Esta prueba se denomina amniocentesis. El lquido amnitico se obtiene introduciendo una aguja en el vientre (abdomen). Se realiza para controlar los cromosomas en aquellos casos en los que existe alguna preocupacin acerca de algn problema gentico que pueda sufrir el beb. En ocasiones se lleva a cabo cerca del final del embarazo, si es necesario inducir al parto. En este caso se realiza para asegurarse que los pulmones del beb estn lo suficientemente maduros como para que pueda vivir fuera del tero. CAMBIOS QUE OCURREN EN EL SEGUNDO TRIMESTRE DEL EMBARAZO Su organismo atravesar numerosos cambios durante el embarazo. Estos pueden variar de una persona a otra. Converse con el profesional que la asiste acerca los cambios que usted note y que la preocupen.  Durante el segundo trimestre probablemente sienta un aumento del apetito. Es normal tener "antojos" de ciertas comidas. Esto vara de una persona a otra y de un embarazo a otro.  El abdomen inferior comenzar a abultarse.  Podr tener la necesidad de orinar con ms frecuencia debido a que   el tero y el beb presionan sobre la vejiga. Tambin es frecuente contraer ms infecciones urinarias durante el embarazo (dolor al orinar). Puede evitarlas bebiendo gran cantidad de lquidos y vaciando  la vejiga antes y despus de mantener relaciones sexuales.  Podrn aparecer las primeras estras en las caderas, abdomen y mamas. Estos son cambios normales del cuerpo durante el embarazo. No existen medicamentos ni ejercicios que puedan prevenir estos cambios.  Es posible que comience a desarrollar venas inflamadas y abultadas (varices) en las piernas. El uso de medias de descanso, elevar sus pies durante 15 minutos, 3 a 4 veces al da y limitar la sal en su dieta ayuda a aliviar el problema.  Podr sentir acidez gstrica a medida que el tero crece y presiona contra el estmago. Puede tomar anticidos, con la autorizacin de su mdico, para aliviar este problema. Tambin es til ingerir pequeas comidas 4 a 5 veces al da.  La constipacin puede tratarse con un laxante o agregando fibra a su dieta. Beber grandes cantidades de lquidos, comer vegetales, frutas y granos integrales es de gran ayuda.  Tambin es beneficioso practicar actividad fsica. Si ha sido una persona activa hasta el embarazo, podr continuar con la mayora de las actividades durante el mismo. Si ha sido menos activa, puede ser beneficioso que comience con un programa de ejercicios, como realizar caminatas.  Puede desarrollar hemorroides (vrices en el recto) hacia el final del segundo trimestre. Tomar baos de asiento tibios y utilizar cremas recomendadas por el profesional que lo asiste sern de ayuda para los problemas de hemorroides.  Tambin podr sentir dolor de espalda durante este momento de su embarazo. Evite levantar objetos pesados, utilice zapatos de taco bajo y mantenga una buena postura para ayudar a reducir los problemas de espalda.  Algunas mujeres embarazadas desarrollan hormigueo y adormecimiento de la mano y los dedos debido a la hinchazn y compresin de los ligamentos de la mueca (sndrome del tnel carpiano). Esto desaparece una vez que el beb nace.  Como sus pechos se agrandan, necesitar un sujetador  ms grande. Use un sostn de soporte, cmodo y de algodn. No utilice un sostn para amamantar hasta el ltimo mes de embarazo si va a amamantar al beb.  Podr observar una lnea oscura desde el ombligo hacia la zona pbica denominada linea nigra.  Podr observar que sus mejillas se ponen coloradas debido al aumento de flujo sanguneo en la cara.  Podr desarrollar "araitas" en la cara, cuello y pecho. Esto desaparece una vez que el beb nace. INSTRUCCIONES PARA EL CUIDADO DOMICILIARIO  Es extremadamente importante que evite el cigarrillo, hierbas medicinales, alcohol y las drogas no prescriptas durante el embarazo. Estas sustancias qumicas afectan la formacin y el desarrollo del beb. Evite estas sustancias durante todo el embarazo para asegurar el nacimiento de un beb sano.  La mayor parte de los cuidados que se aconsejan son los mismos que los indicados para el primer trimestre del embarazo. Cumpla con las citas tal como se le indic. Siga las instrucciones del profesional que lo asiste con respecto al uso de los medicamentos, el ejercicio y la dieta.  Durante el embarazo debe obtener nutrientes para usted y para su beb. Consuma alimentos balanceados a intervalos regulares. Elija alimentos como carne, pescado, leche y otros productos lcteos descremados, vegetales, frutas, panes integrales y cereales. El profesional le informar cul es el aumento de peso ideal.  Las relaciones sexuales fsicas pueden continuarse hasta cerca del fin del embarazo si no existen otros problemas. Estos   problemas pueden ser la prdida temprana (prematura) de lquido amnitico de las membranas, sangrado vaginal, dolor abdominal u otros problemas mdicos o del embarazo.  Realice actividad fsica todos los das, si no tiene restricciones. Consulte con el profesional que la asiste si no sabe con certeza si determinados ejercicios son seguros. El mayor aumento de peso tiene lugar durante los ltimos 2 trimestres del  embarazo. El ejercicio la ayudar a:  Controlar su peso.  Ponerla en forma para el parto.  Ayudarla a perder peso luego de haber dado a luz.  Use un buen sostn o como los que se usan para hacer deportes para aliviar la sensibilidad de las mamas. Tambin puede serle til si lo usa mientras duerme. Si pierde calostro, podr utilizar apsitos en el sostn.  No utilice la baera con agua caliente, baos turcos y saunas durante el embarazo.  Utilice el cinturn de seguridad sin excepcin cuando conduzca. Este la proteger a usted y al beb en caso de accidente.  Evite comer carne cruda, queso crudo, y el contacto con los utensilios y desperdicios de los gatos. Estos elementos contienen grmenes que pueden causar defectos de nacimiento en el beb.  El segundo trimestre es un buen momento para visitar a su dentista y evaluar su salud dental si an no lo ha hecho. Es importante mantener los dientes limpios. Utilice un cepillo de dientes blando. Cepllese ms suavemente durante el embarazo.  Es ms fcil perder algo de orina durante el embarazo. Apretar y fortalecer los msculos de la pelvis la ayudar con este problema. Practique detener la miccin cuando est en el bao. Estos son los mismos msculos que necesita fortalecer. Son tambin los mismos msculos que utiliza cuando trata de evitar los gases. Puede practicar apretando estos msculos 10 veces, y repetir esto 3 veces por da aproximadamente. Una vez que conozca qu msculos debe apretar, no realice estos ejercicios durante la miccin. Puede favorecerle una infeccin si la orina vuelve hacia atrs.  Pida ayuda si tiene necesidades econmicas, de asesoramiento o nutricionales durante el embarazo. El profesional podr ayudarla con respecto a estas necesidades, o derivarla a otros especialistas.  La piel puede ponerse grasa. Si esto sucede, lvese la cara con un jabn suave, utilice un humectante no graso y maquillaje con base de aceite o  crema. CONSUMO DE MEDICAMENTOS Y DROGAS DURANTE EL EMBARAZO  Contine tomando las vitaminas apropiadas para esta etapa tal como se le indic. Las vitaminas deben contener un miligramo de cido flico y deben suplementarse con hierro. Guarde todas las vitaminas fuera del alcance de los nios. La ingestin de slo un par de vitaminas o tabletas que contengan hierro puede ocasionar la muerte en un beb o en un nio pequeo.  Evite el uso de medicamentos, inclusive los de venta libre y hierbas que no hayan sido prescriptos o indicados por el profesional que la asiste. Algunos medicamentos pueden causar problemas fsicos al beb. Utilice los medicamentos de venta libre o de prescripcin para el dolor, el malestar o la fiebre, segn se lo indique el profesional que lo asiste. No utilice aspirina.  El consumo de alcohol est relacionado con ciertos defectos de nacimiento. Esto incluye el sndrome de alcoholismo fetal. Debe evitar el consumo de alcohol en cualquiera de sus formas. El cigarrillo causa nacimientos prematuros y bebs de bajo peso. El uso de drogas recreativas est absolutamente prohibido. Son muy nocivas para el beb. Un beb que nace de una madre adicta, ser adicto al nacer. Ese beb tendr los mismos   sntomas de abstinencia que un adulto.  Infrmele al profesional si consume alguna droga.  No consuma drogas ilegales. Pueden causarle mucho dao al beb. SOLICITE ATENCIN MDICA SI: Tiene preguntas o preocupaciones durante su embarazo. Es mejor que llame para consultar las dudas que esperar hasta su prxima visita prenatal. De esta forma se sentir ms tranquila.  SOLICITE ATENCIN MDICA DE INMEDIATO SI:  La temperatura oral se eleva sin motivo por encima de 102 F (38.9 C) o segn le indique el profesional que lo asiste.  Tiene una prdida de lquido por la vagina (canal de parto). Si sospecha una ruptura de las membranas, tmese la temperatura y llame al profesional para informarlo sobre  esto.  Observa unas pequeas manchas, una hemorragia vaginal o elimina cogulos. Notifique al profesional acerca de la cantidad y de cuntos apsitos est utilizando. Unas pequeas manchas de sangre son algo comn durante el embarazo, especialmente despus de mantener relaciones sexuales.  Presenta un olor desagradable en la secrecin vaginal y observa un cambio en el color, de transparente a blanco.  Contina con las nuseas y no obtiene alivio de los remedios indicados. Vomita sangre o algo similar a la borra del caf.  [place] o sube ms de 900 g. en una semana, o segn lo indicado por el profesional que la asiste.  Observa que se le hinchan el rostro, las manos, los pies o las piernas.  Ha estado expuesta a la rubola y no ha sufrido la enfermedad.  Ha estado expuesta a la quinta enfermedad o a la varicela.  Presenta dolor abdominal. Las molestias en el ligamento redondo son una causa no cancerosa (benigna) frecuente de dolor abdominal durante el embarazo. El profesional que la asiste deber evaluarla.  Presenta dolor de cabeza intenso que no se alivia.  Presenta fiebre, diarrea, dolor al orinar o le falta la respiracin.  Presenta dificultad para ver, visin borrosa, o visin doble.  Sufre una cada, un accidente de trnsito o cualquier tipo de trauma.  Vive en un hogar en el que existe violencia fsica o mental. Document Released: 05/21/2005 Document Revised: 11/03/2011 [employer] Patient Information 2013 [employer].  

## 2012-11-24 NOTE — Progress Notes (Signed)
Pulse- 98 

## 2012-11-25 LAB — RPR

## 2012-11-25 LAB — HIV ANTIBODY (ROUTINE TESTING W REFLEX): HIV: NONREACTIVE

## 2012-11-25 LAB — GLUCOSE TOLERANCE, 1 HOUR (50G) W/O FASTING: Glucose, 1 Hour GTT: 97 mg/dL (ref 70–140)

## 2012-12-01 ENCOUNTER — Ambulatory Visit (HOSPITAL_COMMUNITY)
Admission: RE | Admit: 2012-12-01 | Discharge: 2012-12-01 | Disposition: A | Discharge status: Home / Self Care | Payer: Self-pay | Source: Ambulatory Visit | Attending: Obstetrics and Gynecology | Admitting: Obstetrics and Gynecology

## 2012-12-01 DIAGNOSIS — Z3402 Encounter for supervision of normal first pregnancy, second trimester: Secondary | ICD-10-CM

## 2012-12-01 DIAGNOSIS — Z3689 Encounter for other specified antenatal screening: Secondary | ICD-10-CM | POA: Insufficient documentation

## 2012-12-15 ENCOUNTER — Ambulatory Visit (INDEPENDENT_AMBULATORY_CARE_PROVIDER_SITE_OTHER): Payer: Self-pay | Admitting: Advanced Practice Midwife

## 2012-12-15 ENCOUNTER — Other Ambulatory Visit: Payer: Self-pay | Admitting: Obstetrics & Gynecology

## 2012-12-15 ENCOUNTER — Encounter: Payer: Self-pay | Admitting: Advanced Practice Midwife

## 2012-12-15 VITALS — BP 116/75 | Temp 97.0°F | Wt 147.6 lb

## 2012-12-15 DIAGNOSIS — Z3493 Encounter for supervision of normal pregnancy, unspecified, third trimester: Secondary | ICD-10-CM

## 2012-12-15 DIAGNOSIS — Z3480 Encounter for supervision of other normal pregnancy, unspecified trimester: Secondary | ICD-10-CM

## 2012-12-15 LAB — POCT URINALYSIS DIP (DEVICE)
Bilirubin Urine: NEGATIVE
Glucose, UA: NEGATIVE mg/dL
Nitrite: NEGATIVE
Specific Gravity, Urine: 1.015 (ref 1.005–1.030)
Urobilinogen, UA: 0.2 mg/dL (ref 0.0–1.0)

## 2012-12-15 NOTE — Progress Notes (Signed)
I saw and examined patient along with student and agree with above note.   Amber Thomas 12/15/2012 10:41 AM

## 2012-12-15 NOTE — Progress Notes (Signed)
Doing well. Complains of small amount of white discharge with no irritation or odor. 1 hr glucola normal. Repeat US with difficult to visualize outflow tract due to fetal position. Advised that light exercise is acceptable during pregnancy. Signs of labor reviewed. Will see in f/u in 2 weeks.

## 2012-12-15 NOTE — Patient Instructions (Addendum)
Embarazo  Tercer trimestre  (Pregnancy - Third Trimester) El tercer trimestre del embarazo (los ltimos 3 meses) es el perodo en el cual tanto usted como su beb crecen con ms rapidez. El beb alcanza un largo de aproximadamente 50 cm. y pesa entre 2,700 y 4,500 kg. El beb gana ms tejido graso y est listo para la vida fuera del cuerpo de la madre. Mientras estn en el interior, los bebs tienen perodos de sueo y vigilia, succionan el pulgar y tienen hipo. Quizs sienta pequeas contracciones del tero. Este es el falso trabajo de parto. Tambin se las conoce como contracciones de Braxton-Hicks . Es como una prctica del parto. Los problemas ms habituales de esta etapa del embarazo incluyen mayor dificultad para respirar, hinchazn de las manos y los pies por retencin de lquidos y la necesidad de orinar con ms frecuencia debido a que el tero y el beb presionan sobre la vejiga.  EXAMENES PRENATALES   Durante los exmenes prenatales, deber seguir realizndose anlisis de sangre. Estas pruebas se realizan para controlar su salud y la del beb. Los anlisis de sangre se realizan para conocer los niveles de algunos compuestos de la sangre (hemoglobina). La anemia (bajo nivel de hemoglobina) es frecuente durante el embarazo. Para prevenirla, se administran hierro y vitaminas. Tambin le tomarn nuevas anlisis para descartar diabetes. Podrn repetirle algunas de las pruebas que le hicieron previamente.  En cada visita le medirn el tamao del tero. Esto permite asegurar que el beb se desarrolla adecuadamente, segn la fecha del embarazo.  Le controlarn la presin arterial en cada visita prenatal. Esto es para asegurarse de que no sufre toxemia.  Le harn un anlisis de orina en cada visita prenatal, para descartar infecciones, diabetes y la presencia de protenas.  Tambin en cada visita controlarn su peso. Esto se realiza para asegurarse que aumenta de peso al ritmo indicado y que usted y su  beb evolucionan normalmente.  En algunas ocasiones se realiza una prueba de ultrasonido para confirmar el correcto desarrollo y evolucin del beb. Esta prueba se realiza con ondas sonoras inofensivas para el beb, de modo que el profesional pueda calcular ms precisamente la fecha del parto.  Analice con su mdico los analgsicos y la anestesia que recibir durante el trabajo de parto y el parto.  Comente la posibilidad de que necesite una cesrea y qu anestesia se recibir.  Informe a su mdico si sufre violencia familiar mental o fsica. A veces, se indica la prueba especializada sin estrs, la prueba de tolerancia a las contracciones y el perfil biofsico para asegurarse de que el beb no tiene problemas. El estudio del lquido amnitico que rodea al beb se llama amniocentesis. El lquido amnitico se obtiene introduciendo una aguja en el vientre (abdomen ). En ocasiones se lleva a cabo cerca del final del embarazo, si es necesario inducir a un parto. En este caso se realiza para asegurarse que los pulmones del beb estn lo suficientemente maduros como para que pueda vivir fuera del tero. Si los pulmones no han madurado y es peligroso que el beb nazca, se administrar a la madre una inyeccin de cortisona , 1 a 2 das antes del parto. . Esto ayuda a que los pulmones del beb maduren y sea ms seguro su nacimiento.  CAMBIOS QUE OCURREN EN EL TERCER TRIMESTRE DEL EMBARAZO  Su organismo atravesar numerosos cambios durante el embarazo. Estos pueden variar de una persona a otra. Converse con el profesional que la asiste acerca los cambios que   usted note y que la preocupen.   Durante el ltimo trimestre probablemente sienta un aumento del apetito. Es normal tener "antojos" de ciertas comidas. Esto vara de una persona a otra y de un embarazo a otro.  Podrn aparecer las primeras estras en las caderas, abdomen y mamas. Estos son cambios normales del cuerpo durante el embarazo. No existen  medicamentos ni ejercicios que puedan prevenir estos cambios.  La constipacin puede tratarse con un laxante o agregando fibra a su dieta. Beber grandes cantidades de lquidos, tomar fibras en forma de vegetales, frutas y granos integrales es de gran ayuda.  Tambin es beneficioso practicar actividad fsica. Si ha sido una persona activa hasta el embarazo, podr continuar con la mayora de las actividades durante el mismo. Si ha sido menos activa, puede ser beneficioso que comience con un programa de ejercicios, como realizar caminatas. Consulte con el profesional que la asiste antes de comenzar un programa de ejercicios.  Evite el consumo de cigarrillos, el alcohol, los medicamentos no recetados y las "drogas de la calle" durante el embarazo. Estas sustancias qumicas afectan la formacin y el desarrollo del beb. Evite estas sustancias durante todo el embarazo para asegurar el nacimiento de un beb sano.  Podr sentir dolor de espalda, tener vrices en las venas y hemorroides, o si ya los sufra, pueden empeorar.  Durante el tercer trimestre se cansar con ms facilidad, lo cual es normal.  Los movimientos del beb pueden ser ms fuertes y con ms frecuencia.  Puede que note dificultades para respirar normalmente.  El ombligo puede salir hacia afuera.  A veces sale una secrecin amarilla de las mamas, que se llama calostro.  Podr aparecer una secrecin mucosa con sangre. Esto suele ocurrir entre unos pocos das y una semana antes del parto. INSTRUCCIONES PARA EL CUIDADO EN EL HOGAR   Cumpla con las citas de control. Siga las indicaciones del mdico con respecto al uso de medicamentos, los ejercicios y la dieta.  Durante el embarazo debe obtener nutrientes para usted y para su beb. Consuma alimentos balanceados a intervalos regulares. Elija alimentos como carne, pescado, leche y otros productos lcteos descremados, vegetales, frutas, panes integrales y cereales. El mdico le informar  cul es el aumento de peso ideal.  Las relaciones sexuales pueden continuarse hasta casi el final del embarazo, si no se presentan otros problemas como prdida prematura (antes de tiempo) de lquido amnitico, hemorragia vaginal o dolor en el vientre (abdominal).  Realice actividad fsica todos los das, si no tiene restricciones. Consulte con el profesional que la asiste si no sabe con certeza si determinados ejercicios son seguros. El mayor aumento de peso se producir en los ltimos 2 trimestres del embarazo. El ejercicio ayuda a:  Controlar su peso.  Mantenerse en forma para el trabajo de parto y el parto .  Perder peso despus del parto.  Haga reposo con frecuencia, con las piernas elevadas, o segn lo necesite para evitar los calambres y el dolor de cintura.  Use un buen sostn o como los que se usan para hacer deportes para aliviar la sensibilidad de las mamas. Tambin puede serle til si lo usa mientras duerme. Si pierde calostro, podr utilizar apsitos en el sostn.  No utilice la baera con agua caliente, baos turcos y saunas.  Colquese el cinturn de seguridad cuando conduzca. Este la proteger a usted y al beb en caso de accidente.  Evite comer carne cruda y el contacto con los utensilios y desperdicios de los gatos. Estos elementos   contienen grmenes que pueden causar defectos de nacimiento en el beb.  Es fcil perder algo de orina durante el embarazo. Apretar y fortalecer los msculos de la pelvis la ayudar con este problema. Practique detener la miccin cuando est en el bao. Estos son los mismos msculos que necesita fortalecer. Son tambin los mismos msculos que utiliza cuando trata de evitar despedir gases. Puede practicar apretando estos msculos diez veces, y repetir esto tres veces por da aproximadamente. Una vez que conozca qu msculos debe apretar, no realice estos ejercicios durante la miccin. Puede favorecerle una infeccin si la orina vuelve hacia  atrs.  Pida ayuda si tienen necesidades financieras, teraputicas o nutricionales. El profesional podr ayudarla con respecto a estas necesidades, o derivarla a otros especialistas.  Haga una lista de nmeros telefnicos de emergencia y tngalos disponibles.  Planifique como obtener ayuda de familiares o amigos cuando regrese a casa desde el hospital.  Hacer un ensayo sobre la partida al hospital.  Tome clases prenatales con el padre para entender, practicar y hacer preguntas sobre el trabajo de parto y el alumbramiento.  Preparar la habitacin del beb / busque una guardera.  No viaje fuera de la ciudad a menos que sea absolutamente necesario y con el asesoramiento de su mdico.  Use slo zapatos de tacn bajo o sin tacn para tener mejor equilibrio y evitar cadas. USO DE MEDICAMENTOS Y CONSUMO DE DROGAS DURANTE EL EMBARAZO   Tome las vitaminas apropiadas para esta etapa tal como se le indic. Las vitaminas deben contener un miligramo de cido flico. Guarde todas las vitaminas fuera del alcance de los nios. La ingestin de slo un par de vitaminas o tabletas que contengan hierro pueden ocasionar la muerte en un beb o en un nio pequeo.  Evite el uso de todos los medicamentos, incluyendo hierbas, medicamentos de venta libre, sin receta o que no hayan sido sugeridos por su mdico. Slo tome medicamentos de venta libre o medicamentos recetados para el dolor, el malestar o fiebre como lo indique su mdico. No tome aspirina, ibuprofeno (Motrin, Advil, Nuprin) o naproxeno (Aleve) excepto que su mdico se lo indique.  Infrmele al profesional si consume alguna droga.  El alcohol se relaciona con ciertos defectos congnitos. Incluye el sndrome de alcoholismo fetal. Debe evitar absolutamente el consumo de alcohol, en cualquier forma. El fumar produce baja tasa de natalidad y bebs prematuros.  Las drogas ilegales o de la calle son muy perjudiciales para el beb. Estn absolutamente  prohibidas. Un beb que nace de una madre adicta, ser adicto al nacer. Ese beb tendr los mismos sntomas de abstinencia que un adulto. SOLICITE ATENCIN MDICA SI:  Tiene preguntas o preocupaciones relacionadas con el embarazo. Es mejor que llame para formular las preguntas si no puede esperar hasta la prxima visita, que sentirse preocupada por ellas.  DECISIONES ACERCA DE LA CIRCUNCISIN  Usted puede saber o no cul es el sexo de su beb. Si ya sabe que ser un varn, este es el momento de pensar acerca de la circuncisin. La circuncisin es la extirpacin del prepucio. Esta es la piel que cubre el extremo sensible del pene. No hay un motivo mdico que lo justifique. Generalmente la decisin se toma segn lo que sea popular en ese momento, o segn creencias religiosas. Podr conversar estos temas con su mdico o con el pediatra.  SOLICITE ATENCIN MDICA DE INMEDIATO SI:   La temperatura oral le sube a ms de 102 F (38.9 C) o lo que su mdico le   indique.  Tiene una prdida de lquido por la vagina (canal de parto). Si sospecha una ruptura de las membranas, tmese la temperatura y llame al profesional para informarlo sobre esto.  Observa unas pequeas manchas, una hemorragia vaginal o elimina cogulos. Notifique al profesional acerca de la cantidad y de cuntos apsitos est utilizando.  Presenta un olor desagradable en la secrecin vaginal y observa un cambio en el color, de transparente a blanco.  Ha vomitado durante ms de 24 horas.  Siente escalofros o le sube la fiebre.  Le falta el aire.  Siente ardor al orinar.  Baja o sube ms de 2 libras (900 g), o segn lo indicado por el profesional que la asiste.  Observa que sbitamente se le hinchan el rostro, las manos, los pies o las piernas.  Siente dolor en el vientre (abdominal). Las molestias en el ligamento redondo son una causa benigna frecuente de dolor abdominal durante el embarazo. El profesional que la asiste deber  evaluarla.  Presenta dolor de cabeza intenso que no se alivia.  Tiene problemas visuales, visin doble o borrosa.  Si no siente los movimientos del beb durante ms de 1 hora. Si piensa que el beb no se mueve tanto como lo haca habitualmente, coma algo que contenga azcar y recustese sobre el lado izquierdo durante una hora. El beb debe moverse al menos 4  5 veces por hora. Comunquese inmediatamente si el beb se mueve menos que lo indicado.  Se cae, se ve involucrada en un accidente automovilstico o sufre algn tipo de traumatismo.  En su hogar hay violencia mental o fsica. Document Released: 05/21/2005 Document Revised: 02/10/2012 [employer] Patient Information 2013 [employer].  

## 2012-12-29 ENCOUNTER — Ambulatory Visit (INDEPENDENT_AMBULATORY_CARE_PROVIDER_SITE_OTHER): Payer: Self-pay | Admitting: Family Medicine

## 2012-12-29 VITALS — BP 115/71 | Temp 98.9°F | Wt 147.9 lb

## 2012-12-29 DIAGNOSIS — Z3493 Encounter for supervision of normal pregnancy, unspecified, third trimester: Secondary | ICD-10-CM

## 2012-12-29 DIAGNOSIS — Z3480 Encounter for supervision of other normal pregnancy, unspecified trimester: Secondary | ICD-10-CM

## 2012-12-29 LAB — POCT URINALYSIS DIP (DEVICE)
Bilirubin Urine: NEGATIVE
Glucose, UA: 100 mg/dL — AB
Hgb urine dipstick: NEGATIVE
Ketones, ur: NEGATIVE mg/dL
Leukocytes, UA: NEGATIVE
Nitrite: NEGATIVE

## 2012-12-29 NOTE — Progress Notes (Signed)
Pulse: 100

## 2012-12-29 NOTE — Progress Notes (Signed)
Constipation, declines medication. Discussed dietary fiber. No other concerns.

## 2012-12-29 NOTE — Patient Instructions (Addendum)
Embarazo  Tercer trimestre  (Pregnancy - Third Trimester) El tercer trimestre del embarazo (los ltimos 3 meses) es el perodo en el cual tanto usted como su beb crecen con ms rapidez. El beb alcanza un largo de aproximadamente 50 cm. y pesa entre 2,700 y 4,500 kg. El beb gana ms tejido graso y est listo para la vida fuera del cuerpo de la madre. Mientras estn en el interior, los bebs tienen perodos de sueo y vigilia, succionan el pulgar y tienen hipo. Quizs sienta pequeas contracciones del tero. Este es el falso trabajo de parto. Tambin se las conoce como contracciones de Braxton-Hicks . Es como una prctica del parto. Los problemas ms habituales de esta etapa del embarazo incluyen mayor dificultad para respirar, hinchazn de las manos y los pies por retencin de lquidos y la necesidad de orinar con ms frecuencia debido a que el tero y el beb presionan sobre la vejiga.  EXAMENES PRENATALES   Durante los exmenes prenatales, deber seguir realizndose anlisis de sangre. Estas pruebas se realizan para controlar su salud y la del beb. Los anlisis de sangre se realizan para conocer los niveles de algunos compuestos de la sangre (hemoglobina). La anemia (bajo nivel de hemoglobina) es frecuente durante el embarazo. Para prevenirla, se administran hierro y vitaminas. Tambin le tomarn nuevas anlisis para descartar diabetes. Podrn repetirle algunas de las pruebas que le hicieron previamente.  En cada visita le medirn el tamao del tero. Esto permite asegurar que el beb se desarrolla adecuadamente, segn la fecha del embarazo.  Le controlarn la presin arterial en cada visita prenatal. Esto es para asegurarse de que no sufre toxemia.  Le harn un anlisis de orina en cada visita prenatal, para descartar infecciones, diabetes y la presencia de protenas.  Tambin en cada visita controlarn su peso. Esto se realiza para asegurarse que aumenta de peso al ritmo indicado y que usted y su  beb evolucionan normalmente.  En algunas ocasiones se realiza una prueba de ultrasonido para confirmar el correcto desarrollo y evolucin del beb. Esta prueba se realiza con ondas sonoras inofensivas para el beb, de modo que el profesional pueda calcular ms precisamente la fecha del parto.  Analice con su mdico los analgsicos y la anestesia que recibir durante el trabajo de parto y el parto.  Comente la posibilidad de que necesite una cesrea y qu anestesia se recibir.  Informe a su mdico si sufre violencia familiar mental o fsica. A veces, se indica la prueba especializada sin estrs, la prueba de tolerancia a las contracciones y el perfil biofsico para asegurarse de que el beb no tiene problemas. El estudio del lquido amnitico que rodea al beb se llama amniocentesis. El lquido amnitico se obtiene introduciendo una aguja en el vientre (abdomen ). En ocasiones se lleva a cabo cerca del final del embarazo, si es necesario inducir a un parto. En este caso se realiza para asegurarse que los pulmones del beb estn lo suficientemente maduros como para que pueda vivir fuera del tero. Si los pulmones no han madurado y es peligroso que el beb nazca, se administrar a la madre una inyeccin de cortisona , 1 a 2 das antes del parto. . Esto ayuda a que los pulmones del beb maduren y sea ms seguro su nacimiento.  CAMBIOS QUE OCURREN EN EL TERCER TRIMESTRE DEL EMBARAZO  Su organismo atravesar numerosos cambios durante el embarazo. Estos pueden variar de una persona a otra. Converse con el profesional que la asiste acerca los cambios que   usted note y que la preocupen.   Durante el ltimo trimestre probablemente sienta un aumento del apetito. Es normal tener "antojos" de ciertas comidas. Esto vara de una persona a otra y de un embarazo a otro.  Podrn aparecer las primeras estras en las caderas, abdomen y mamas. Estos son cambios normales del cuerpo durante el embarazo. No existen  medicamentos ni ejercicios que puedan prevenir estos cambios.  La constipacin puede tratarse con un laxante o agregando fibra a su dieta. Beber grandes cantidades de lquidos, tomar fibras en forma de vegetales, frutas y granos integrales es de gran ayuda.  Tambin es beneficioso practicar actividad fsica. Si ha sido una persona activa hasta el embarazo, podr continuar con la mayora de las actividades durante el mismo. Si ha sido menos activa, puede ser beneficioso que comience con un programa de ejercicios, como realizar caminatas. Consulte con el profesional que la asiste antes de comenzar un programa de ejercicios.  Evite el consumo de cigarrillos, el alcohol, los medicamentos no recetados y las "drogas de la calle" durante el embarazo. Estas sustancias qumicas afectan la formacin y el desarrollo del beb. Evite estas sustancias durante todo el embarazo para asegurar el nacimiento de un beb sano.  Podr sentir dolor de espalda, tener vrices en las venas y hemorroides, o si ya los sufra, pueden empeorar.  Durante el tercer trimestre se cansar con ms facilidad, lo cual es normal.  Los movimientos del beb pueden ser ms fuertes y con ms frecuencia.  Puede que note dificultades para respirar normalmente.  El ombligo puede salir hacia afuera.  A veces sale una secrecin amarilla de las mamas, que se llama calostro.  Podr aparecer una secrecin mucosa con sangre. Esto suele ocurrir entre unos pocos das y una semana antes del parto. INSTRUCCIONES PARA EL CUIDADO EN EL HOGAR   Cumpla con las citas de control. Siga las indicaciones del mdico con respecto al uso de medicamentos, los ejercicios y la dieta.  Durante el embarazo debe obtener nutrientes para usted y para su beb. Consuma alimentos balanceados a intervalos regulares. Elija alimentos como carne, pescado, leche y otros productos lcteos descremados, vegetales, frutas, panes integrales y cereales. El mdico le informar  cul es el aumento de peso ideal.  Las relaciones sexuales pueden continuarse hasta casi el final del embarazo, si no se presentan otros problemas como prdida prematura (antes de tiempo) de lquido amnitico, hemorragia vaginal o dolor en el vientre (abdominal).  Realice actividad fsica todos los das, si no tiene restricciones. Consulte con el profesional que la asiste si no sabe con certeza si determinados ejercicios son seguros. El mayor aumento de peso se producir en los ltimos 2 trimestres del embarazo. El ejercicio ayuda a:  Controlar su peso.  Mantenerse en forma para el trabajo de parto y el parto .  Perder peso despus del parto.  Haga reposo con frecuencia, con las piernas elevadas, o segn lo necesite para evitar los calambres y el dolor de cintura.  Use un buen sostn o como los que se usan para hacer deportes para aliviar la sensibilidad de las mamas. Tambin puede serle til si lo usa mientras duerme. Si pierde calostro, podr utilizar apsitos en el sostn.  No utilice la baera con agua caliente, baos turcos y saunas.  Colquese el cinturn de seguridad cuando conduzca. Este la proteger a usted y al beb en caso de accidente.  Evite comer carne cruda y el contacto con los utensilios y desperdicios de los gatos. Estos elementos   contienen grmenes que pueden causar defectos de nacimiento en el beb.  Es fcil perder algo de orina durante el embarazo. Apretar y fortalecer los msculos de la pelvis la ayudar con este problema. Practique detener la miccin cuando est en el bao. Estos son los mismos msculos que necesita fortalecer. Son tambin los mismos msculos que utiliza cuando trata de evitar despedir gases. Puede practicar apretando estos msculos diez veces, y repetir esto tres veces por da aproximadamente. Una vez que conozca qu msculos debe apretar, no realice estos ejercicios durante la miccin. Puede favorecerle una infeccin si la orina vuelve hacia  atrs.  Pida ayuda si tienen necesidades financieras, teraputicas o nutricionales. El profesional podr ayudarla con respecto a estas necesidades, o derivarla a otros especialistas.  Haga una lista de nmeros telefnicos de emergencia y tngalos disponibles.  Planifique como obtener ayuda de familiares o amigos cuando regrese a casa desde el hospital.  Hacer un ensayo sobre la partida al hospital.  Tome clases prenatales con el padre para entender, practicar y hacer preguntas sobre el trabajo de parto y el alumbramiento.  Preparar la habitacin del beb / busque una guardera.  No viaje fuera de la ciudad a menos que sea absolutamente necesario y con el asesoramiento de su mdico.  Use slo zapatos de tacn bajo o sin tacn para tener mejor equilibrio y evitar cadas. USO DE MEDICAMENTOS Y CONSUMO DE DROGAS DURANTE EL EMBARAZO   Tome las vitaminas apropiadas para esta etapa tal como se le indic. Las vitaminas deben contener un miligramo de cido flico. Guarde todas las vitaminas fuera del alcance de los nios. La ingestin de slo un par de vitaminas o tabletas que contengan hierro pueden ocasionar la muerte en un beb o en un nio pequeo.  Evite el uso de todos los medicamentos, incluyendo hierbas, medicamentos de venta libre, sin receta o que no hayan sido sugeridos por su mdico. Slo tome medicamentos de venta libre o medicamentos recetados para el dolor, el malestar o fiebre como lo indique su mdico. No tome aspirina, ibuprofeno (Motrin, Advil, Nuprin) o naproxeno (Aleve) excepto que su mdico se lo indique.  Infrmele al profesional si consume alguna droga.  El alcohol se relaciona con ciertos defectos congnitos. Incluye el sndrome de alcoholismo fetal. Debe evitar absolutamente el consumo de alcohol, en cualquier forma. El fumar produce baja tasa de natalidad y bebs prematuros.  Las drogas ilegales o de la calle son muy perjudiciales para el beb. Estn absolutamente  prohibidas. Un beb que nace de una madre adicta, ser adicto al nacer. Ese beb tendr los mismos sntomas de abstinencia que un adulto. SOLICITE ATENCIN MDICA SI:  Tiene preguntas o preocupaciones relacionadas con el embarazo. Es mejor que llame para formular las preguntas si no puede esperar hasta la prxima visita, que sentirse preocupada por ellas.  DECISIONES ACERCA DE LA CIRCUNCISIN  Usted puede saber o no cul es el sexo de su beb. Si ya sabe que ser un varn, este es el momento de pensar acerca de la circuncisin. La circuncisin es la extirpacin del prepucio. Esta es la piel que cubre el extremo sensible del pene. No hay un motivo mdico que lo justifique. Generalmente la decisin se toma segn lo que sea popular en ese momento, o segn creencias religiosas. Podr conversar estos temas con su mdico o con el pediatra.  SOLICITE ATENCIN MDICA DE INMEDIATO SI:   La temperatura oral le sube a ms de 102 F (38.9 C) o lo que su mdico le   indique.  Tiene una prdida de lquido por la vagina (canal de parto). Si sospecha una ruptura de las membranas, tmese la temperatura y llame al profesional para informarlo sobre esto.  Observa unas pequeas manchas, una hemorragia vaginal o elimina cogulos. Notifique al profesional acerca de la cantidad y de cuntos apsitos est utilizando.  Presenta un olor desagradable en la secrecin vaginal y observa un cambio en el color, de transparente a blanco.  Ha vomitado durante ms de 24 horas.  Siente escalofros o le sube la fiebre.  Le falta el aire.  Siente ardor al orinar.  Baja o sube ms de 2 libras (900 g), o segn lo indicado por el profesional que la asiste.  Observa que sbitamente se le hinchan el rostro, las manos, los pies o las piernas.  Siente dolor en el vientre (abdominal). Las molestias en el ligamento redondo son una causa benigna frecuente de dolor abdominal durante el embarazo. El profesional que la asiste deber  evaluarla.  Presenta dolor de cabeza intenso que no se alivia.  Tiene problemas visuales, visin doble o borrosa.  Si no siente los movimientos del beb durante ms de 1 hora. Si piensa que el beb no se mueve tanto como lo haca habitualmente, coma algo que contenga azcar y recustese sobre el lado izquierdo durante una hora. El beb debe moverse al menos 4  5 veces por hora. Comunquese inmediatamente si el beb se mueve menos que lo indicado.  Se cae, se ve involucrada en un accidente automovilstico o sufre algn tipo de traumatismo.  En su hogar hay violencia mental o fsica. Document Released: 05/21/2005 Document Revised: 02/10/2012 [employer] Patient Information 2013 [employer].  

## 2013-01-12 ENCOUNTER — Ambulatory Visit (INDEPENDENT_AMBULATORY_CARE_PROVIDER_SITE_OTHER): Payer: Self-pay | Admitting: Family Medicine

## 2013-01-12 VITALS — BP 143/70 | Wt 149.1 lb

## 2013-01-12 DIAGNOSIS — Z3480 Encounter for supervision of other normal pregnancy, unspecified trimester: Secondary | ICD-10-CM

## 2013-01-12 DIAGNOSIS — Z3493 Encounter for supervision of normal pregnancy, unspecified, third trimester: Secondary | ICD-10-CM

## 2013-01-12 LAB — POCT URINALYSIS DIP (DEVICE)
Bilirubin Urine: NEGATIVE
Ketones, ur: NEGATIVE mg/dL
Leukocytes, UA: NEGATIVE
Specific Gravity, Urine: 1.025 (ref 1.005–1.030)

## 2013-01-12 NOTE — Progress Notes (Signed)
P = 110   Pt is concerned that her belly is too small and baby may not be big enough.

## 2013-01-12 NOTE — Progress Notes (Signed)
No complaints. Initial BP 143/70, repeat 104/70. No headache or vision changes. No proteinuria. Recheck BP next visit.

## 2013-01-12 NOTE — Patient Instructions (Signed)
Hipertensin durante el embarazo  (Hypertension During Pregnancy) La hipertensin tambin se denomina presin arterial alta. Puede ocurrir en cualquier momento de la vida y tambin durante el [place]. Cuando se sufre hipertensin, existe una presin extra en el interior de los vasos sanguneos que llevan la sangre desde el corazn al resto del cuerpo (arterias). La hipertensin durante el embarazo puede causar problemas para usted y el beb. Puede ser que el beb no tenga el peso adecuado al nacer o puede nacer antes de tiempo (prematuro). En los casos muy graves de hipertensin durante el embarazo puede estar en peligro la vida.  Hay diferentes tipos de hipertensin Academic librarian.   Hipertensin crnica. Esto sucede cuando una mujer sufre de hipertensin antes del embarazo y contina durante el mismo.  Hipertensin gestacional. Es cuando se desarrolla la hipertensin Academic librarian.  Preeclampsia o toxemia del embarazo. Es un tipo muy grave de hipertensin que se desarrolla slo Academic librarian. Es una enfermedad que afecta a todo el cuerpo (sistmica) y puede ser muy peligrosa tanto para la madre como para el beb.  La hipertensin gestacional y preeclampsia por lo general desaparecen despus de nacer el beb. La presin arterial generalmente se estabiliza dentro de las 6 semanas. Las mujeres que sufren de hipertensin durante el embarazo tienen una mayor probabilidad de Environmental education officer hipertensin en etapas posteriores de la vida o en embarazos futuros.  ENTENDER LA PRESIN ARTERIAL  La presin arterial hace que se mueva la sangre en el cuerpo. A veces, la fuerza que [employer] sangre es demasiado intensa.   La lectura de la presin arterial se expresa en 2 nmeros y se ve como una fraccin.  El primer nmero es la presin sistlica. Cuando el corazn late, fuerza a que fluya ms sangre por las arterias. La presin dentro de las arterias [place].  El nmero inferior es la presin  diastlica. La presin baja [employer] latidos. Eso ocurre cuando el corazn est en reposo.  Usted puede tener hipertensin si:  La presin arterial sistlica es superior a 140.  La presin diastlica es superior a 90. FACTORES DE RIESGO  Algunos factores favorecen el desarrollo de la hipertensin Academic librarian. Los factores de riesgo son:   Sufrir hipertensin antes del Psychiatrist.  Haber sufrido hipertensin durante un embarazo anterior.  Tener sobrepeso.  Ser mayor de 40 aos.  Estar embarazada de ms de un beb (mltiples).  Tener diabetes o problemas renales. SNTOMAS  La hipertensin gestacional y crnica pueden no causar sntomas. La preeclampsia causa sntomas, que pueden ser:   Aumento de las protenas en la orina. El mdico va a controlar esto en cada control prenatal.  Hinchazn de las manos y la cara.  Aumento rpido de [place].  Dolores de [place].  Cambios visuales.  Molestias al ver la luz.  Dolor abdominal, especialmente en el rea superior derecha.  Dolor en el pecho.  Falta de aire.  Aumento de los reflejos.  Convulsiones. Las convulsiones ocurren en una forma ms grave de preeclampsia, llamada eclampsia. DIAGNSTICO   Puede ser diagnosticada con hipertensin en el embarazo durante un control prenatal regular. En cada visita, las pruebas pueden ser:  Control de la presin arterial.  Anlisis de orina para detectar protenas en la orina.  El tipo de hipertensin que se diagnostica depende del momento en que se desarroll. Tambin depende de la lectura de su presin arterial especfica.  El desarrollo de hipertensin antes de las 20 semanas de embarazo es consistente con  hipertensin crnica.  El desarrollo de la hipertensin despus de las 20 semanas de embarazo es consistente con hipertensin gestacional.  Hipertensin con aumento de la protena urinaria se diagnostica como preeclampsia.  Las mediciones de la presin arterial de ms de  160 sistlica o 110 diastlica son un signo de preeclampsia grave. TRATAMIENTO  El tratamiento para la hipertensin durante el embarazo vara. Depende del tipo de hipertensin y de su gravedad.   Si toma medicamentos para la hipertensin crnica, puede que tenga que cambiarlos.  Los medicamentos llamados inhibidores de la ECA no deben tomarse Academic librarian.  Para las mujeres que tienen factores de riesgo de preeclampsia pueden recomendarse bajas dosis de aspirina.  Si usted tiene Occupational hygienist, tendr que tomar un medicamento para la presin arterial que sea seguro durante el [place]. Su mdico le Paediatric nurse apropiado.  Si tiene preeclampsia grave, es posible que tenga que Engineer, maintenance hospital. Los mdicos la controlarn a usted y al beb muy de cerca. Puede ser que necesite tomar medicamentos (sulfato de magnesio) para prevenir las convulsiones y reducir la presin arterial.  A veces es necesario un parto prematuro. Este puede ser el caso si el problema [place]. Se hace para protegerlos a usted y a su beb. La nica cura para la preeclampsia es el parto. INSTRUCCIONES PARA EL CUIDADO EN EL HOGAR   Cumpla con todos los controles prenatales regulares.  Siga las indicaciones del profesional con respecto a Adult nurse. Dgale a su mdico sobre todos los [employer] toma. Incluya los medicamentos de [place].  Consuma la menor cantidad posible de sal.  Realice actividad fsica con regularidad.  No beba alcohol.  No use productos que contengan tabaco.  No tome bebidas con cafena.  Acustese sobre el lado izquierdo cuando haga reposo.  Informe a su mdico si tiene sntomas de preeclampsia. SOLICITE ATENCIN MDICA DE INMEDIATO SI:   Siente un dolor abdominal intenso.  Observa hinchazn repentina y [employer], tobillos o el rostro.  Aumento de peso de ms de 4 libras (1,8 kg) o ms en una semana.  Vomita  repetidas veces.  Presenta una hemorragia vaginal abundante.  No siente los movimientos del beb.  Sufre una cefalea grave.  Tiene visin doble o borrosa.  Tiene calambres o espasmos musculares.  Le falta el aire.  Tiene las yemas de los dedos y los labios [place].  Observa sangre en la orina. ASEGRESE DE QUE:   Comprende estas instrucciones.  Controlar su enfermedad.  Solicitar ayuda de inmediato si no mejora o si empeora. Document Released: 07/31/2011 Document Revised: 11/03/2011 [facility] Patient Information 2014 [place], [place].  Pregnancy - Third Trimester The third trimester of pregnancy (the last 3 months) is a period of the most rapid growth for you and your baby. The baby approaches a length of 20 inches and a weight of 6 to 10 pounds. The baby is adding on fat and getting ready for life outside your body. While inside, babies have periods of sleeping and waking, sucking thumbs, and hiccuping. You can often feel small contractions of the uterus. This is false labor. It is also called Braxton-Hicks contractions. This is like a practice for labor. The usual problems in this stage of pregnancy include more difficulty breathing, swelling of the hands and feet from water retention, and having to urinate more often because of the uterus and baby pressing on your bladder.  PRENATAL EXAMS  Blood work may continue to be done  during prenatal exams. These tests are done to check on your health and the probable health of your baby. Blood work is used to follow your blood levels (hemoglobin). Anemia (low hemoglobin) is common during pregnancy. Iron and vitamins are given to help prevent this. You may also continue to be checked for diabetes. Some of the past blood tests may be done again.  The size of the uterus is measured during each visit. This makes sure your baby is growing properly according to your pregnancy dates.  Your blood pressure is checked every prenatal visit. This is  to make sure you are not getting toxemia.  Your urine is checked every prenatal visit for infection, diabetes, and protein.  Your weight is checked at each visit. This is done to make sure gains are happening at the suggested rate and that you and your baby are growing normally.  Sometimes, an ultrasound is performed to confirm the position and the proper growth and development of the baby. This is a test done that bounces harmless sound waves off the baby so your caregiver can more accurately determine a due date.  Discuss the type of pain medicine and anesthesia you will have during your labor and delivery.  Discuss the possibility and anesthesia if a cesarean section might be necessary.  Inform your caregiver if there is any mental or physical violence at home. Sometimes, a specialized non-stress test, contraction stress test, and biophysical profile are done to make sure the baby is not having a problem. Checking the amniotic fluid surrounding the baby is called an amniocentesis. The amniotic fluid is removed by sticking a needle into the belly (abdomen). This is sometimes done near the end of pregnancy if an early delivery is required. In this case, it is done to help make sure the baby's lungs are mature enough for the baby to live outside of the womb. If the lungs are not mature and it is unsafe to deliver the baby, an injection of cortisone medicine is given to the mother 1 to 2 days before the delivery. This helps the baby's lungs mature and makes it safer to deliver the baby. CHANGES OCCURING IN THE THIRD TRIMESTER OF PREGNANCY Your body goes through many changes during pregnancy. They vary from person to person. Talk to your caregiver about changes you notice and are concerned about.  During the last trimester, you have probably had an increase in your appetite. It is normal to have cravings for certain foods. This varies from person to person and pregnancy to pregnancy.  You may begin  to get stretch marks on your hips, abdomen, and breasts. These are normal changes in the body during pregnancy. There are no exercises or medicines to take which prevent this change.  Constipation may be treated with a stool softener or adding bulk to your diet. Drinking lots of fluids, fiber in vegetables, fruits, and whole grains are helpful.  Exercising is also helpful. If you have been very active up until your pregnancy, most of these activities can be continued during your pregnancy. If you have been less active, it is helpful to start an exercise program such as walking. Consult your caregiver before starting exercise programs.  Avoid all smoking, alcohol, non-prescribed drugs, herbs and "street drugs" during your pregnancy. These chemicals affect the formation and growth of the baby. Avoid chemicals throughout the pregnancy to ensure the delivery of a healthy infant.  Backache, varicose veins, and hemorrhoids may develop or get worse.  You will  tire more easily in the third trimester, which is normal.  The baby's movements may be stronger and more often.  You may become short of breath easily.  Your belly button may stick out.  A yellow discharge may leak from your breasts called colostrum.  You may have a bloody mucus discharge. This usually occurs a few days to a week before labor begins. HOME CARE INSTRUCTIONS   Keep your caregiver's appointments. Follow your caregiver's instructions regarding medicine use, exercise, and diet.  During pregnancy, you are providing food for you and your baby. Continue to eat regular, well-balanced meals. Choose foods such as meat, fish, milk and other low fat dairy products, vegetables, fruits, and whole-grain breads and cereals. Your caregiver will tell you of the ideal weight gain.  A physical sexual relationship may be continued throughout pregnancy if there are no other problems such as early (premature) leaking of amniotic fluid from the  membranes, vaginal bleeding, or belly (abdominal) pain.  Exercise regularly if there are no restrictions. Check with your caregiver if you are unsure of the safety of your exercises. Greater weight gain will occur in the last 2 trimesters of pregnancy. Exercising helps:  Control your weight.  Get you in shape for labor and delivery.  You lose weight after you deliver.  Rest a lot with legs elevated, or as needed for leg cramps or low back pain.  Wear a good support or jogging bra for breast tenderness during pregnancy. This may help if worn during sleep. Pads or tissues may be used in the bra if you are leaking colostrum.  Do not use hot tubs, steam rooms, or saunas.  Wear your seat belt when driving. This protects you and your baby if you are in an accident.  Avoid raw meat, cat litter boxes and soil used by cats. These carry germs that can cause birth defects in the baby.  It is easier to leak urine during pregnancy. Tightening up and strengthening the pelvic muscles will help with this problem. You can practice stopping your urination while you are going to the bathroom. These are the same muscles you need to strengthen. It is also the muscles you would use if you were trying to stop from passing gas. You can practice tightening these muscles up 10 times a set and repeating this about 3 times per day. Once you know what muscles to tighten up, do not perform these exercises during urination. It is more likely to cause an infection by backing up the urine.  Ask for help if you have financial, counseling, or nutritional needs during pregnancy. Your caregiver will be able to offer counseling for these needs as well as refer you for other special needs.  Make a list of emergency phone numbers and have them available.  Plan on getting help from family or friends when you go home from the hospital.  Make a trial run to the hospital.  Take prenatal classes with the father to understand,  practice, and ask questions about the labor and delivery.  Prepare the baby's room or nursery.  Do not travel out of the city unless it is absolutely necessary and with the advice of your caregiver.  Wear only low or no heal shoes to have better balance and prevent falling. MEDICINES AND DRUG USE IN PREGNANCY  Take prenatal vitamins as directed. The vitamin should contain 1 milligram of folic acid. Keep all vitamins out of reach of children. Only a couple vitamins or tablets containing  iron may be fatal to a baby or young child when ingested.  Avoid use of all medicines, including herbs, over-the-counter medicines, not prescribed or suggested by your caregiver. Only take over-the-counter or prescription medicines for pain, discomfort, or fever as directed by your caregiver. Do not use aspirin, ibuprofen or naproxen unless approved by your caregiver.  Let your caregiver also know about herbs you may be using.  Alcohol is related to a number of birth defects. This includes fetal alcohol syndrome. All alcohol, in any form, should be avoided completely. Smoking will cause low birth rate and premature babies.  Illegal drugs are very harmful to the baby. They are absolutely forbidden. A baby born to an addicted mother will be addicted at birth. The baby will go through the same withdrawal an adult does. SEEK MEDICAL CARE IF: You have any concerns or worries during your pregnancy. It is better to call with your questions if you feel they cannot wait, rather than worry about them. SEEK IMMEDIATE MEDICAL CARE IF:   An unexplained oral temperature above 102 F (38.9 C) develops, or as your caregiver suggests.  You have leaking of fluid from the vagina. If leaking membranes are suspected, take your temperature and tell your caregiver of this when you call.  There is vaginal spotting, bleeding or passing clots. Tell your caregiver of the amount and how many pads are used.  You develop a bad smelling  vaginal discharge with a change in the color from clear to white.  You develop vomiting that lasts more than 24 hours.  You develop chills or fever.  You develop shortness of breath.  You develop burning on urination.  You loose more than 2 pounds of weight or gain more than 2 pounds of weight or as suggested by your caregiver.  You notice sudden swelling of your face, hands, and feet or legs.  You develop belly (abdominal) pain. Round ligament discomfort is a common non-cancerous (benign) cause of abdominal pain in pregnancy. Your caregiver still must evaluate you.  You develop a severe headache that does not go away.  You develop visual problems, blurred or double vision.  If you have not felt your baby move for more than 1 hour. If you think the baby is not moving as much as usual, eat something with sugar in it and lie down on your left side for an hour. The baby should move at least 4 to 5 times per hour. Call right away if your baby moves less than that.  You fall, are in a car accident, or any kind of trauma.  There is mental or physical violence at home. Document Released: 08/05/2001 Document Revised: 05/05/2012 Document Reviewed: 02/07/2009 [facility] Patient Information 2014 [place], [place].

## 2013-01-26 ENCOUNTER — Ambulatory Visit (INDEPENDENT_AMBULATORY_CARE_PROVIDER_SITE_OTHER): Payer: Self-pay | Admitting: Advanced Practice Midwife

## 2013-01-26 VITALS — BP 114/71 | Wt 152.7 lb

## 2013-01-26 DIAGNOSIS — Z3493 Encounter for supervision of normal pregnancy, unspecified, third trimester: Secondary | ICD-10-CM

## 2013-01-26 DIAGNOSIS — Z3480 Encounter for supervision of other normal pregnancy, unspecified trimester: Secondary | ICD-10-CM

## 2013-01-26 LAB — POCT URINALYSIS DIP (DEVICE)
Bilirubin Urine: NEGATIVE
Glucose, UA: 100 mg/dL — AB
Ketones, ur: NEGATIVE mg/dL
Specific Gravity, Urine: 1.015 (ref 1.005–1.030)

## 2013-01-26 NOTE — Progress Notes (Signed)
Pulse: 108

## 2013-01-26 NOTE — Progress Notes (Signed)
No complaints feeling well. GBS at next visit.

## 2013-02-02 ENCOUNTER — Ambulatory Visit (INDEPENDENT_AMBULATORY_CARE_PROVIDER_SITE_OTHER): Payer: Self-pay | Admitting: Advanced Practice Midwife

## 2013-02-02 VITALS — BP 122/78 | Wt 151.6 lb

## 2013-02-02 DIAGNOSIS — Z3480 Encounter for supervision of other normal pregnancy, unspecified trimester: Secondary | ICD-10-CM

## 2013-02-02 DIAGNOSIS — Z3493 Encounter for supervision of normal pregnancy, unspecified, third trimester: Secondary | ICD-10-CM

## 2013-02-02 LAB — POCT URINALYSIS DIP (DEVICE)
Bilirubin Urine: NEGATIVE
Glucose, UA: NEGATIVE mg/dL
Hgb urine dipstick: NEGATIVE
Ketones, ur: NEGATIVE mg/dL
Nitrite: NEGATIVE
Specific Gravity, Urine: 1.025 (ref 1.005–1.030)

## 2013-02-02 LAB — OB RESULTS CONSOLE GBS: GBS: NEGATIVE

## 2013-02-02 NOTE — Progress Notes (Signed)
She has some bumps on her external genitalia. She has been using [place], and states it is not helping.  GBS collected today. Folliculitis to the mons pubis. Discussed care.

## 2013-02-02 NOTE — Progress Notes (Signed)
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

## 2013-02-02 NOTE — Addendum Note (Signed)
Addended by: Franchot Mimes on: 02/02/2013 02:35 PM   Modules accepted: Orders

## 2013-02-03 LAB — GC/CHLAMYDIA PROBE AMP
CT Probe RNA: NEGATIVE
GC Probe RNA: NEGATIVE

## 2013-02-09 ENCOUNTER — Ambulatory Visit (INDEPENDENT_AMBULATORY_CARE_PROVIDER_SITE_OTHER): Payer: Self-pay | Admitting: Advanced Practice Midwife

## 2013-02-09 VITALS — BP 120/69 | Wt 153.9 lb

## 2013-02-09 DIAGNOSIS — Z3480 Encounter for supervision of other normal pregnancy, unspecified trimester: Secondary | ICD-10-CM

## 2013-02-09 DIAGNOSIS — Z3493 Encounter for supervision of normal pregnancy, unspecified, third trimester: Secondary | ICD-10-CM

## 2013-02-09 LAB — POCT URINALYSIS DIP (DEVICE)
Bilirubin Urine: NEGATIVE
Glucose, UA: NEGATIVE mg/dL
Ketones, ur: NEGATIVE mg/dL
Leukocytes, UA: NEGATIVE
Protein, ur: NEGATIVE mg/dL

## 2013-02-09 NOTE — Progress Notes (Signed)
P = 95  Pt reports some swelling of feet. She had some lower abdominal pain, pressure and UC's yesterday.  Labor sx reviewed.

## 2013-02-13 ENCOUNTER — Encounter: Payer: Self-pay | Admitting: Advanced Practice Midwife

## 2013-02-13 NOTE — Progress Notes (Signed)
Doing OK but has some discomfort with irregular contractions. Reviewed signs of labor and where to go.

## 2013-02-13 NOTE — Patient Instructions (Signed)
Parto vaginal  (Vaginal Delivery) En primer lugar, su mdico debe estar seguro de que usted est en trabajo de parto. Algunos signos son:  Puede haber eliminado el "tapn mucoso" antes que comience el trabajo de parto. Se trata de una pequea cantidad de mucus con sangre.  Tiene contracciones uterinas regulares.  El tiempo entre las contracciones se acorta.  Las molestias y el dolor se hacen gradualmente ms intensos.  El dolor se ubica principalmente en la espalda.  Los dolores empeoran al caminar.  El cuello del tero (la apertura del tero) se hace ms delgada, comienza a borrarse, y se abre (se dilata). Una vez que se encuentre en trabajo de parto y sea admitida en el hospital, el mdico har lo siguiente:  Un examen fsico completo.  Controlar sus signos vitales (presin arterial, pulso, temperatura y la frecuencia cardaca fetal).  Realizar un examen vaginal (usando un guante estril y lubricante) para determinar:  La posicin (presentacin) del beb (ceflica [vertex] o nalgas primero).  El nivel (plano) de la cabeza del beb en el canal de parto.  El borramiento y dilatacin del cuello del tero.  Le rasurarn el vello pbico y le aplicarn una enema segn lo considere el mdico y las circunstancias.  Generalmente se coloca un monitor electrnico sobre el abdomen. El monitor sigue la duracin e intensidad de las contracciones, as como la frecuencia cardaca del beb.  Generalmente, el profesional inserta una va intravenosa en el brazo para administrarle agua azucarada. Esta es una medida de precaucin, de modo que puedan administrarle rpidamente medicamentos durante el trabajo de parto. EL TRABAJO DE PARTO Y PARTO NORMALES SE DIVIDEN EN 3 ETAPAS: Primera etapa Comienzan las contracciones regulares y el cuello comienza a borrarse y dilatarse. Esta etapa puede durar entre 3 y 15 horas. El final de la primera etapa se considera cuando el cuello est borrado en un 100%  y se ha dilatado 10 cm. Le administrarn analgsicos por:  Inyeccin (morfina, demerol, etc.).  Anestesia regional (espinal, caudal o epidural, anestsicos colocados en diferentes regiones de la columna vertebral). Podrn administrarle medicamentos para el dolor en la regin paracervical, que consiste en la aplicacin de un anestsico inyectable en cada uno de los lados del cuello del tero. La embarazada puede requerir un "parto natural", es decir no recibir medicamentos o anestesia durante el trabajo de parto y el parto. Segunda etapa En este momento el beb baja a travs del canal de parto (vagina) y nace. Esto puede durar entre 1 y 4 horas. A medida que el beb asoma la cabeza por el canal de parto, podr sentir una sensacin similar a cuando mueve el intestino. Sentir el impulse de empujar con fuerza hasta que el nio salga. A medida que la cabecita baja, el mdico decidir si realiza una episiotoma (corte en el perineo y rea de la vagina) para evitar la ruptura de los tejidos. Luego del nacimiento del beb y la expulsin de la placenta, la episiotoma se sutura. En algunos casos se coloca a la madre una mscara con xido nitroso para facilitar la respiracin y aliviar el dolor. El final de la etapa 2 se produce cuando el beb ha salido completamente. Luego, cuando el cordn umbilical deja de pulsar, se pinza y se corta. Tercera etapa La tercera etapa comienza luego que el beb ha nacido y finaliza luego de la expulsin de la placenta. Generalmente esto lleva entre 5 y 30 minutos. Luego de la expulsin de la placenta, le aplicarn un medicamento por   va intravenosa para ayudar a contraer el tero y prevenir hemorragias. En la tercera etapa no hay dolor y generalmente no son necesarios los analgsicos. Si le han realizado una episiotoma, es el momento de repararla. Luego del parto, la mam es observada y controlada exhaustivamente durante 1  2 horas para verificar que no hay sangrado en el post  parto (hemorragias). Si pierde mucha sangre, le administrarn un medicamento para contraer el tero y detener la hemorragia. Document Released: 07/24/2008 Document Revised: 05/05/2012 [employer] Patient Information 2014 [employer].  

## 2013-02-16 ENCOUNTER — Inpatient Hospital Stay (HOSPITAL_COMMUNITY)
Admission: AD | Admit: 2013-02-16 | Discharge: 2013-02-16 | Disposition: A | Discharge status: Home / Self Care | Payer: Self-pay | Source: Ambulatory Visit | Attending: Obstetrics & Gynecology | Admitting: Obstetrics & Gynecology

## 2013-02-16 ENCOUNTER — Ambulatory Visit (INDEPENDENT_AMBULATORY_CARE_PROVIDER_SITE_OTHER): Payer: Self-pay | Admitting: Advanced Practice Midwife

## 2013-02-16 ENCOUNTER — Encounter (HOSPITAL_COMMUNITY): Payer: Self-pay | Admitting: *Deleted

## 2013-02-16 ENCOUNTER — Inpatient Hospital Stay (HOSPITAL_COMMUNITY): Payer: Self-pay

## 2013-02-16 VITALS — BP 114/73 | Wt 156.1 lb

## 2013-02-16 DIAGNOSIS — O368190 Decreased fetal movements, unspecified trimester, not applicable or unspecified: Secondary | ICD-10-CM

## 2013-02-16 DIAGNOSIS — O368131 Decreased fetal movements, third trimester, fetus 1: Secondary | ICD-10-CM

## 2013-02-16 DIAGNOSIS — Z3493 Encounter for supervision of normal pregnancy, unspecified, third trimester: Secondary | ICD-10-CM

## 2013-02-16 DIAGNOSIS — O368130 Decreased fetal movements, third trimester, not applicable or unspecified: Secondary | ICD-10-CM

## 2013-02-16 LAB — POCT URINALYSIS DIP (DEVICE)
Glucose, UA: NEGATIVE mg/dL
Hgb urine dipstick: NEGATIVE
Nitrite: NEGATIVE
Urobilinogen, UA: 0.2 mg/dL (ref 0.0–1.0)

## 2013-02-16 NOTE — Progress Notes (Signed)
Pulse: 106

## 2013-02-16 NOTE — MAU Note (Signed)
Reports she is not feeling baby move much today. Denies pain or vag bleeding. Reports normal white vaginal discharge.

## 2013-02-16 NOTE — Patient Instructions (Signed)
Parto vaginal  (Vaginal Delivery) En Restaurant manager, fast food, su mdico debe estar seguro de que usted est en trabajo de parto. Algunos signos son:  Puede haber eliminado el "tapn mucoso" antes que comience el trabajo de [place]. Se trata de una pequea cantidad de mucus con sangre.  Tiene contracciones uterinas regulares.  El [employer] las contracciones se acorta.  Las molestias y Chief Technology Officer se hacen gradualmente ms intensos.  El dolor se ubica principalmente en la espalda.  Los dolores empeoran al [employer].  El cuello del tero (la apertura del tero) se hace ms delgada, comienza a borrarse, y se abre (se dilata). Una vez que se encuentre en [place] parto y sea admitida en el hospital, el mdico har lo siguiente:  Un examen fsico completo.  Controlar sus signos vitales (presin arterial, pulso, temperatura y la frecuencia cardaca fetal).  Realizar un examen vaginal (usando un guante estril y lubricante) para determinar:  La posicin (presentacin) del beb (ceflica [vertex] o nalgas primero).  El nivel (plano) de la cabeza del beb en el canal de parto.  El borramiento y dilatacin del cuello del tero.  Le rasurarn el vello pbico y le aplicarn una enema segn lo considere el mdico y las circunstancias.  Generalmente se coloca un monitor electrnico sobre el abdomen. El monitor sigue la duracin e intensidad de las contracciones, as como la frecuencia cardaca del beb.  Generalmente, el profesional inserta una va intravenosa en el brazo para administrarle agua azucarada. Esta es una medida de precaucin, de modo que puedan administrarle rpidamente medicamentos durante el [place]. EL TRABAJO DE PARTO Y PARTO NORMALES SE DIVIDEN EN 3 ETAPAS: Primera etapa Comienzan las contracciones regulares y el cuello comienza a borrarse y dilatarse. Esta etapa puede durar entre 3 y 15 horas. El final de la primera etapa se considera cuando el cuello est borrado en un 100%  y se ha dilatado 10 cm. Le administrarn analgsicos por:  Inyeccin (morfina, demerol, etc.).  Anestesia regional (espinal, caudal o epidural, anestsicos colocados en diferentes regiones de la columna vertebral). Podrn administrarle medicamentos para el dolor en la regin paracervical, que consiste en la aplicacin de un anestsico inyectable en cada uno de los lados del cuello del tero. La embarazada puede requerir un "parto natural", es decir no recibir medicamentos o anestesia durante el [place]. Segunda etapa En este momento el beb baja a travs del canal de parto (vagina) y nace. Esto puede durar entre 1 y 4 horas. A medida que el beb asoma la cabeza por el canal de parto, podr sentir una sensacin similar a cuando mueve el intestino. Sentir el impulse de empujar con fuerza hasta que el nio salga. A medida que la cabecita baja, el mdico decidir si realiza una episiotoma (corte en el perineo y rea de la vagina) para evitar la ruptura de los tejidos. Luego del nacimiento del beb y la expulsin de la placenta, la episiotoma se sutura. En algunos casos se coloca a la madre una mscara con xido nitroso para Research officer, political party respiracin y Engineer, materials. El final de la etapa 2 se produce cuando el beb ha salido completamente. Luego, cuando el cordn umbilical deja de pulsar, se pinza y se corta. Tercera etapa La tercera etapa comienza luego que el beb ha nacido y finaliza luego de la expulsin de la placenta. Generalmente esto lleva entre 5 y 30 minutos. Luego de la expulsin de la placenta, le aplicarn un medicamento por  va intravenosa para ayudar a Engineer, materials y Psychiatric nurse. En la tercera etapa no hay dolor y generalmente no son necesarios los analgsicos. Si le han realizado una episiotoma, es el momento de Sales promotion account executive. Luego del parto, la mam es observada y controlada exhaustivamente durante 1  2 horas para verificar que no hay sangrado en el post  parto (hemorragias). Si pierde [employer], le administrarn un medicamento para Engineer, manufacturing tero y Comptroller. Document Released: 07/24/2008 Document Revised: 05/05/2012 [facility] Patient Information 2014 [place], [place].

## 2013-02-16 NOTE — MAU Provider Note (Signed)
  History     CSN: [id number]  Arrival date and time: 02/16/13 1639   First Provider Initiated Contact with Patient 02/16/13 1741      Chief Complaint  Patient presents with  . Decreased Fetal Movement   HPI Amber Thomas is a 27 y.o. G1P0 at [redacted]w[redacted]d who presents from clinic with decreased fetal movement.   Patient had NST in clinic today.  Noted to have little variability, so she was sent for continued monitoring and BPP.  Patient is feeling well with no complaints.  She denies contractions, loss of fluid, or vaginal bleeding.   Past Medical History  Diagnosis Date  . Chlamydia 2011  . Medical history non-contributory     Past Surgical History  Procedure Laterality Date  . No past surgeries      Family History  Problem Relation Age of Onset  . Thyroid disease Sister   . Diabetes Paternal Grandfather   . Depression Paternal Grandmother     History  Substance Use Topics  . Smoking status: Never Smoker   . Smokeless tobacco: Not on file  . Alcohol Use: No    Allergies: No Known Allergies  Prescriptions prior to admission  Medication Sig Dispense Refill  . mupirocin cream (BACTROBAN) 2 % Apply topically 3 (three) times daily.  15 g  0  . Prenatal Vit-Fe Fumarate-FA (MULTIVITAMIN-PRENATAL) 27-0.8 MG TABS Take 1 tablet by mouth daily.        ROS Per HPI Physical Exam   Blood pressure 115/67, pulse 81, temperature 98.3 F (36.8 C), temperature source Oral, resp. rate 18, height 5\' 3"  (1.6 m), weight 71.305 kg (157 lb 3.2 oz), last menstrual period 05/25/2012.  Physical Exam Gen: well appearing, NAD. Heart: RRR Lungs: CTAB.  Abd: gravid but otherwise soft, nontender to palpation Ext: no appreciable lower extremity edema bilaterally Neuro: no focal deficits. GU: normal appearing external genitalia Cervical exam: Dilation: Closed Effacement (%): Thick Exam by:: Kathlyn Sacramento, MD  FHR: baseline 135, mod variability, 15x15 accels, no decels Toco:  occasional UC.  MAU Course  Procedures  MDM NST, BPP  Assessment and Plan  27 y.o. G1P0 at [redacted]w[redacted]d who presents from clinic with decreased fetal movement and nonreactive NST. - Reactive NST in MAU. - BPP 8/8. Will discharge home.   [name] 02/16/2013, 6:10 PM   I saw and examined patient and agree with above resident note. I reviewed history, imaging, labs, and vitals. I personally reviewed the fetal heart tracing, and it is reactive. Napoleon Form, MD

## 2013-02-16 NOTE — Progress Notes (Signed)
Doing well. Not many UCs.  Baby moving much less lately. Will check NST. >>  One accel noted at beginning of tracing, but rest of it is nonreactive and variability is minimal. Will send to MAU for prolonged monitoring and BPP

## 2013-02-20 NOTE — MAU Provider Note (Signed)
Attestation of Attending Supervision of Advanced Practitioner: Evaluation and management procedures were performed by the PA/NP/CNM/OB Fellow under my supervision/collaboration. Chart reviewed and agree with management and plan.  Tilda Burrow 02/20/2013 5:58 PM  Attestation of Attending Supervision of Advanced Practitioner: Evaluation and management procedures were performed by the PA/NP/CNM/OB Fellow under my supervision/collaboration. Chart reviewed and agree with management and plan.  Star Cheese V 02/20/2013 5:58 PM

## 2013-02-22 ENCOUNTER — Inpatient Hospital Stay (HOSPITAL_COMMUNITY)
Admission: AD | Admit: 2013-02-22 | Discharge: 2013-02-22 | Disposition: A | Discharge status: Home / Self Care | Payer: Self-pay | Source: Ambulatory Visit | Attending: Obstetrics and Gynecology | Admitting: Obstetrics and Gynecology

## 2013-02-22 ENCOUNTER — Encounter (HOSPITAL_COMMUNITY): Payer: Self-pay | Admitting: *Deleted

## 2013-02-22 DIAGNOSIS — O479 False labor, unspecified: Secondary | ICD-10-CM | POA: Insufficient documentation

## 2013-02-22 DIAGNOSIS — Z3493 Encounter for supervision of normal pregnancy, unspecified, third trimester: Secondary | ICD-10-CM

## 2013-02-22 LAB — POCT FERN TEST: POCT Fern Test: NEGATIVE

## 2013-02-22 NOTE — MAU Note (Signed)
Scant amount of bloody mucus seen on perineum. No fluid return during SVE and fern negative.

## 2013-02-22 NOTE — MAU Note (Signed)
SAYS SHE HAS BEEN HURTING SINCE 0200.  HAD VE IN OFFICE LAST WED- TOLD ALL FINE.  DENIES HSV AND MRSA.

## 2013-02-22 NOTE — MAU Note (Signed)
Contractions every 5 mintues for the last 1 1/2 hours. Had a small gush of fluid upon arrival.

## 2013-02-23 ENCOUNTER — Encounter (HOSPITAL_COMMUNITY): Payer: Self-pay | Admitting: *Deleted

## 2013-02-23 ENCOUNTER — Inpatient Hospital Stay (HOSPITAL_COMMUNITY)
Admission: AD | Admit: 2013-02-23 | Discharge: 2013-02-23 | Disposition: A | Discharge status: Home / Self Care | Payer: Self-pay | Source: Ambulatory Visit | Attending: Obstetrics and Gynecology | Admitting: Obstetrics and Gynecology

## 2013-02-23 ENCOUNTER — Encounter: Payer: Self-pay | Admitting: Family Medicine

## 2013-02-23 ENCOUNTER — Telehealth: Payer: Self-pay | Admitting: Obstetrics and Gynecology

## 2013-02-23 DIAGNOSIS — O471 False labor at or after 37 completed weeks of gestation: Secondary | ICD-10-CM

## 2013-02-23 DIAGNOSIS — O479 False labor, unspecified: Secondary | ICD-10-CM | POA: Insufficient documentation

## 2013-02-23 DIAGNOSIS — Z3493 Encounter for supervision of normal pregnancy, unspecified, third trimester: Secondary | ICD-10-CM

## 2013-02-23 HISTORY — DX: Other fracture of unspecified lower leg, initial encounter for closed fracture: S82.899A

## 2013-02-23 MED ORDER — OXYCODONE-ACETAMINOPHEN 5-325 MG PO TABS
2.0000 | ORAL_TABLET | ORAL | Status: AC
  Administered 2013-02-23: 2 via ORAL
  Filled 2013-02-23: qty 2

## 2013-02-23 NOTE — MAU Note (Signed)
Dr. Waynetta Sandy at bedside with Rosey Bath, spanish translator.

## 2013-02-23 NOTE — MAU Provider Note (Signed)
  History    CSN: [id number]  Arrival date and time: 02/23/13 0932   First Provider Initiated Contact with Patient 02/23/13 1850      Chief Complaint  Patient presents with  . Labor Eval   HPI  27 y.o G1P0 at [redacted]w[redacted]d presents today for labor check. 2 nights ago started noticing small amounts of blood, no gush of fluid. Also had abdominal pain starting 2 nights ago, but has increased today and she thinks they are every 3 minutes and "strong".  Positive fetal movement. Has not had any complications of the pregnancy. No nausea/vomiting/diarrhea/fever/chills.  Rece[facility] at [facility] - low risk clinic. No complications of pregnancy.   Past Medical History  Diagnosis Date  . Chlamydia 2011  . Fracture, ankle     left- as child, no problems    Past Surgical History  Procedure Laterality Date  . No past surgeries      Family History  Problem Relation Age of Onset  . Thyroid disease Sister   . Diabetes Paternal Grandmother     History  Substance Use Topics  . Smoking status: Never Smoker   . Smokeless tobacco: Never Used  . Alcohol Use: No    Allergies: No Known Allergies  No prescriptions prior to admission    ROS  See HPI  Physical Exam   Blood pressure 124/74, pulse 111, temperature 98.1 F (36.7 C), temperature source Oral, resp. rate 20, height 5' 2.5" (1.588 m), weight 68.947 kg (152 lb), last menstrual period 05/25/2012.  Physical Exam  General appearance: alert and no distress Head: Normocephalic, without obvious abnormality, atraumatic Lungs: clear to auscultation bilaterally Heart: regular rate and rhythm and S1, S2 normal Abdomen: gravid, non-tender to palpation, fundal height consistent with GA Extremities: no edema, redness or tenderness in the calves or thighs Pulses: 2+ and symmetric  Dilation: Fingertip Effacement (%): 90 Cervical Position: Middle Station: +1 Presentation: Vertex Exam by:: jolynn  EFM: 145bpm, mod variability,  accels present, no decels.  MAU Course  Procedures  MDM   Assessment and Plan  27 y.o. G1P0 at [redacted]w[redacted]d with contractions and bloody show -Not in active labor - Percocet given for pain, discussed when to return to hospital -Stable for discharge  Tawni Carnes 02/23/2013, 9:10 PM   I saw and examined patient and agree with above resident note. I reviewed history, imaging, labs, and vitals. I personally reviewed the fetal heart tracing, and it is reactive. Napoleon Form, MD

## 2013-02-23 NOTE — MAU Note (Signed)
Dr. Thad Ranger at bedside with Amber Thomas in house spanish interpreter explained patient not in active labor at present, will discharge home after given pain medication, patient and husband verbalized an understanding.

## 2013-02-23 NOTE — Telephone Encounter (Signed)
Patient's boyfriend called "Ramone" seeking advice on patient's c/o of contractions q5- 8 mins and with scant blood. The same symptoms that prompted patient to go to MAU yesterday. She is concerned that it is not going away. Per patient's bf, she was advised in MAU to come back only if symptoms worsens. Advised patient and boyfriend to watch out for increasing intensity and frequency of contractions and/or spotting worsens. Patient's boyfriend states understanding.

## 2013-02-23 NOTE — MAU Note (Signed)
Pain, ? Contractions, started on Monday.  Getting stronger and closer, woke her at 0200- unable to sleep.   Small amt of bloody mucous.  Feeling pressure. Decrease in fetal movement.

## 2013-02-24 ENCOUNTER — Ambulatory Visit (INDEPENDENT_AMBULATORY_CARE_PROVIDER_SITE_OTHER): Payer: Self-pay | Admitting: Family Medicine

## 2013-02-24 ENCOUNTER — Inpatient Hospital Stay (HOSPITAL_COMMUNITY)
Admission: AD | Admit: 2013-02-24 | Discharge: 2013-02-24 | Disposition: A | Discharge status: Home / Self Care | Payer: MEDICAID | Source: Ambulatory Visit | Attending: Obstetrics & Gynecology | Admitting: Obstetrics & Gynecology

## 2013-02-24 ENCOUNTER — Encounter (HOSPITAL_COMMUNITY): Payer: Self-pay | Admitting: *Deleted

## 2013-02-24 VITALS — BP 121/70 | Temp 97.4°F | Wt 153.0 lb

## 2013-02-24 DIAGNOSIS — R8281 Pyuria: Secondary | ICD-10-CM

## 2013-02-24 DIAGNOSIS — O479 False labor, unspecified: Secondary | ICD-10-CM | POA: Insufficient documentation

## 2013-02-24 DIAGNOSIS — R82998 Other abnormal findings in urine: Secondary | ICD-10-CM

## 2013-02-24 DIAGNOSIS — O368190 Decreased fetal movements, unspecified trimester, not applicable or unspecified: Secondary | ICD-10-CM

## 2013-02-24 DIAGNOSIS — Z3480 Encounter for supervision of other normal pregnancy, unspecified trimester: Secondary | ICD-10-CM

## 2013-02-24 DIAGNOSIS — Z3493 Encounter for supervision of normal pregnancy, unspecified, third trimester: Secondary | ICD-10-CM

## 2013-02-24 LAB — POCT URINALYSIS DIP (DEVICE)
Glucose, UA: NEGATIVE mg/dL
Ketones, ur: NEGATIVE mg/dL
Protein, ur: NEGATIVE mg/dL
Specific Gravity, Urine: 1.02 (ref 1.005–1.030)
Urobilinogen, UA: 0.2 mg/dL (ref 0.0–1.0)

## 2013-02-24 MED ORDER — MORPHINE SULFATE 4 MG/ML IJ SOLN
4.0000 mg | Freq: Once | INTRAMUSCULAR | Status: AC
  Administered 2013-02-24: 4 mg via INTRAVENOUS
  Filled 2013-02-24: qty 1

## 2013-02-24 MED ORDER — MORPHINE BOLUS VIA INFUSION: 4.0000 mg | Freq: Once | INTRAVENOUS | Status: DC

## 2013-02-24 MED ORDER — LACTATED RINGERS IV SOLN
INTRAVENOUS | Status: DC
  Administered 2013-02-24: 17:00:00 via INTRAVENOUS

## 2013-02-24 MED ORDER — ZOLPIDEM TARTRATE ER 12.5 MG PO TBCR: 12.5000 mg | EXTENDED_RELEASE_TABLET | Freq: Every evening | ORAL | Status: DC | PRN

## 2013-02-24 MED ORDER — PROMETHAZINE HCL 25 MG/ML IJ SOLN
25.0000 mg | Freq: Once | INTRAMUSCULAR | Status: AC
  Administered 2013-02-24: 25 mg via INTRAVENOUS
  Filled 2013-02-24: qty 1

## 2013-02-24 NOTE — Progress Notes (Signed)
To MAU for therapeutic rest.

## 2013-02-24 NOTE — Progress Notes (Signed)
Pos leuks on u/a--will send for cx. Early labor. Pt. To walk and return for repeat cervical exam.

## 2013-02-24 NOTE — MAU Provider Note (Signed)
Chief Complaint:  No chief complaint on file.   First Provider Initiated Contact with Patient 02/24/13 1609      HPI: Abel Presto Garcia-Gutierrez is a 27 y.o. G1P0 at [redacted]w[redacted]d who Has had bloody show and increasingly painful contractions over the last 3 days was seen here last night cervix fingertip 90% 0 station. She received Percocet at discharge which helped her symptoms for a couple of hours. She presented to liver is clinic for her scheduled appointment this morning and cervix was noted to be 100 0 station she ambulated for several hours and this afternoon was still not coping well with the pain and cervix by Dr. Virl Cagey repeat exam was 2 100 0. She sent her to MAU for therapeutic rest. was seen at low risk clinic and noted to be having painful contractions Deniesleakage of fluid  Good fetal movement.   Pregnancy Course:  uncomplicated  Past Medical History: Past Medical History  Diagnosis Date  . Chlamydia 2011  . Fracture, ankle     left- as child, no problems    Past obstetric history: OB History   Grav Para Term Preterm Abortions TAB SAB Ect Mult Living   1              # Outc Date GA Lbr Len/2nd Wgt Sex Del Anes PTL Lv   1 CUR               Past Surgical History: Past Surgical History  Procedure Laterality Date  . No past surgeries      Family History: Family History  Problem Relation Age of Onset  . Thyroid disease Sister   . Diabetes Paternal Grandmother     Social History: History  Substance Use Topics  . Smoking status: Never Smoker   . Smokeless tobacco: Never Used  . Alcohol Use: No    Allergies: No Known Allergies  Meds:  Prescriptions prior to admission  Medication Sig Dispense Refill  . mupirocin cream (BACTROBAN) 2 % Apply topically 3 (three) times daily.  15 g  0  . Prenatal Vit-Fe Fumarate-FA (PRENATAL MULTIVITAMIN) TABS Take 1 tablet by mouth daily at 12 noon.        ROS: Pertinent findings in history of present illness.  Physical  Exam  Blood pressure 111/80, pulse 105, temperature 97.8 F (36.6 C), temperature source Oral, resp. rate 18, last menstrual period 05/25/2012. GENERAL: Well-developed, well-nourished female in no acute distress.  HEENT: normocephalic HEART: normal rate RESP: normal effort ABDOMEN: Soft, non-tender, gravid appropriate for gestational age EXTREMITIES: Nontender, no edema NEURO: alert and oriented SPECULUM EXAM: NEFG, physiologic discharge, no blood, cervix clean Dilation: 2 Effacement (%): 100 Station: 0 Presentation: Vertex Exam by:: D.Dot Splinter,CNM  FHT:  Baseline 135-140 , moderate variability, accelerations present, no decelerations Contractions: q 6-10 mins, palpate moderate   Labs: Results for orders placed in visit on 02/24/13 (from the past 24 hour(s))  POCT URINALYSIS DIP (DEVICE)     Status: Abnormal   Collection Time    02/24/13  9:37 AM      Result Value Range   Glucose, UA NEGATIVE  NEGATIVE mg/dL   Bilirubin Urine NEGATIVE  NEGATIVE   Ketones, ur NEGATIVE  NEGATIVE mg/dL   Specific Gravity, Urine 1.020  1.005 - 1.030   Hgb urine dipstick SMALL (*) NEGATIVE   pH 7.0  5.0 - 8.0   Protein, ur NEGATIVE  NEGATIVE mg/dL   Urobilinogen, UA 0.2  0.0 - 1.0 mg/dL   Nitrite  POSITIVE (*) NEGATIVE   Leukocytes, UA TRACE (*) NEGATIVE    Imaging:  US Fetal Bpp W/o Non Stress  02/16/2013   *RADIOLOGY REPORT*  Clinical Data: Decreased fetal motion.  OBSTETRIC <14 WK ULTRASOUND  Technique: Biophysical profile only.  Comparison:  12/01/2012.  Present examination was requested as a biophysical profile only.  Heart rate 138.  Cephalic lie.  Largest vertical pocket 4.7 centimeters.  2 out of 2 for fetal motion, breathing, tone and amniotic fluid with a total score of 8/8.  IMPRESSION: 8/8 biophysical profile.  Interrogation of fetal anatomy was not performed at the current time as this was not requested.   Original Report Authenticated By: Lacy Duverney, M.D.   MAU Course: Received IV LR  1000 and was given morphine 4 mg/Phenergan 25 mg IV. She slept for about 3 hours and contractions abated 1930: cervix exam unchanged Urine culture sent Assessment:  G1 [redacted]w[redacted]d 1. Prolonged latent phase of labor   Category 1 FHR  Plan: Discharge home Labor precautions and fetal kick counts    Medication List         mupirocin cream 2 %  Commonly known as:  BACTROBAN  Apply topically 3 (three) times daily.     prenatal multivitamin Tabs  Take 1 tablet by mouth daily at 12 noon.     zolpidem 12.5 MG CR tablet  Commonly known as:  AMBIEN CR  Take 1 tablet (12.5 mg total) by mouth at bedtime as needed for sleep.        Danae Orleans, CNM 02/24/2013 7:27 PM

## 2013-02-24 NOTE — Progress Notes (Signed)
P=109, c/o a lot of pelvic pressure/pain/lower back pain since Monday- states has been having mucous with a little blood in it since Monday also. States has been to MAU twice this week.  States contracting today at times every 5 minutes. Used Interpreter Raynelle Fanning

## 2013-02-24 NOTE — Progress Notes (Addendum)
1-2 cm/100/0

## 2013-02-24 NOTE — MAU Provider Note (Signed)
Attestation of Attending Supervision of Advanced Practitioner (CNM/NP): Evaluation and management procedures were performed by the Advanced Practitioner under my supervision and collaboration.  I have reviewed the Advanced Practitioner's note and chart, and I agree with the management and plan.  Leva Baine 02/24/2013 3:06 PM   

## 2013-02-24 NOTE — Patient Instructions (Addendum)
Lactancia materna  (Breastfeeding)  El cambio hormonal durante el Psychiatrist produce el desarrollo del tejido [place] y un aumento en el nmero y tamao de los conductos galactforos. La hormona prolactina permite que las protenas, los azcares y las grasas de la sangre produzcan la [employer] materna en las glndulas productoras de [place]. La hormona progesterona impide que la leche materna sea liberada antes del nacimiento del beb. Despus del nacimiento del beb, su nivel de progesterona disminuye permitiendo que la leche materna sea [place]. Pensar en el beb, as como la succin o Theatre manager, pueden estimular la liberacin de [place] de las glndulas productoras de [place].  La decisin de Company secretary) es una de las mejores opciones que usted puede hacer para usted y su beb. La informacin que sigue da una breve resea de los beneficios, as Lexicographer que debe saber sobre la [place].  LOS BENEFICIOS DE AMAMANTAR  Para el beb   La primera leche (calostro) ayuda al mejor funcionamiento del sistema digestivo del beb.   La leche tiene anticuerpos que provienen de la madre y que ayudan a prevenir las infecciones en el beb.   El beb tiene una menor incidencia de asma, alergias y del sndrome de muerte sbita del lactante (SMSL).   Los nutrientes de la [place] materna son mejores para el beb que la [place].  La leche materna mejora el desarrollo cerebral del beb.   Su beb tendr menos gases, clicos y estreimiento.  Es menos probable que el beb desarrolle otras enfermedades, como obesidad infantil, asma o diabetes mellitus. Para usted   La lactancia materna favorece el desarrollo de un vnculo muy especial entre la madre y el beb.   Es ms conveniente, siempre disponible, a la [place] y [place].   La lactancia materna ayuda a quemar caloras y a perder el peso ganado durante el [place].   Hace que el tero se  contraiga ms rpidamente a su tamao normal y [employer] sangrado despus del [place].   Las [employer] que amamantan tienen menos riesgo de Environmental education officer osteoporosis o cncer de mama o de ovario en el futuro.  FRECUENCIA DEL AMAMANTAMIENTO   Un beb sano, nacido a trmino, puede amamantarse con tanta frecuencia como cada hora, o espaciar las comidas cada tres horas. La frecuencia en la lactancia varan de un beb a otro.   Los recin nacidos deben ser alimentados por lo menos cada 2-3 horas Administrator y cada 4-5 horas durante la noche. Usted debe amamantarlo un mnimo de 8 tomas en un perodo de 24 horas.  Despierte al beb para amamantarlo si han pasado 3-4 horas desde la ltima comida.  Amamante cuando sienta la necesidad de reducir la plenitud de sus senos o cuando el beb muestre signos de [place]. Las seales de que el beb puede Gentry Fitz son:  Lenora Boys su estado de alerta o vigilancia.  Se estira.  Mueve la cabeza de un lado a otro.  Mueve la cabeza y abre la boca cuando se le toca la mejilla o la boca (reflejo de succin).  Aumenta las vocalizaciones, tales como sonidos de succin, relamerse los labios, arrullos, suspiros, o chirridos.  Mueve la [employer] boca.  Se chupa con ganas los dedos o las manos.  Agitacin.  Llanto intermitente.  Los signos de hambre extrema requerirn que lo calme y lo consuele antes de tratar de alimentarlo. Los signos de hambre extrema son:  Agitacin.  Llanto fuerte e intenso.  Gritos.  El amamantamiento frecuente la ayudar a producir ms [place] y a Education officer, community de Engineer, mining en los pezones e hinchazn de las [place].  LACTANCIA MATERNA   Ya sea que se encuentre acostada o sentada, asegrese que el abdomen del beb est enfrente el suyo.   Sostenga la mama con el pulgar por arriba y los otros 4 dedos por debajo del pezn. Asegrese que sus dedos se encuentren lejos del pezn y de la boca del beb.   Empuje suavemente los  labios del beb con el pezn o con el dedo.   Cuando la boca del beb se abra lo suficiente, introduzca el pezn y la zona oscura que lo rodea (areola) tanto como le sea posible dentro de la boca.  Debe haber ms areola visible por arriba del labio superior que por debajo del labio inferior.  La lengua del beb debe estar entre la enca inferior y el seno.  Asegrese de que la boca del beb est en la posicin correcta alrededor del pezn (prendida). Los labios del beb deben crear un sello sobre su pecho.  Las seales de que el beb se ha prendido eficazmente al pezn son:  Payton Doughty o succiona sin dolor.  Se escucha que traga [employer].  No hace ruidos ni chasquidos.  Hay movimientos musculares por arriba y por delante de sus odos al Printmaker.  El beb debe succionar unos 2-3 minutos para que salga la [place]. Permita que el nio se alimente en cada mama todo lo que desee. Alimente al beb hasta que se desprenda o se quede dormido en Freight forwarder y luego ofrzcale el segundo pecho.  Las seales de que el beb est lleno y satisfecho son:  Disminuye gradualmente el nmero de succiones o no succiona.  Se queda dormido.  Extiende o relaja su cuerpo.  Retiene una pequea cantidad de [facility] boca.  Se desprende del pecho por s mismo.  Los signos de una lactancia materna eficaz son:  Los senos han aumentado la firmeza, el peso y el tamao antes de la alimentacin.  Son ms blandos despus de amamantar.  Un aumento del volumen de [place], y tambin el cambio de su consistencia y color se producen hacia el quinto da de Tour manager.  La congestin mamaria se [place] al dar de [place].  Los pezones no duelen, ni estn agrietados ni sangran.  De ser necesario, interrumpa la succin poniendo su dedo en la esquina de la boca del beb y deslizando el dedo entre sus encas. A continuacin, retire la mama de su boca.  Es comn que los bebs regurgiten un poco  despus de comer.  A menudo los bebs tragan aire al alimentarse. Esto puede hacer que se sienta molesto. Hacer eructar al beb al [place] de pecho puede ser de [place].  Se recomiendan suplementos de vitamina D para los bebs que reciben slo [address].  Evite el uso del chupete durante las primeras 4 a 6 semanas de vida.  Evite la alimentacin suplementaria con agua, frmula o jugo en lugar de la [employer]. La leche materna es todo el alimento que el beb necesita. No es necesario que el nio ingiera agua o preparados de bibern. Sus pechos producirn ms leche si se evita la alimentacin suplementaria durante las primeras semanas. COMO SABER SI EL BEB OBTIENE LA SUFICIENTE LECHE MATERNA  Preguntarse si el beb obtiene la cantidad suficiente de [place] es una preocupacin frecuente [employer]. Puede asegurarse que el  beb tiene la leche suficiente si:   El beb succiona activamente y usted escucha que traga.   El beb parece estar relajado y satisfecho despus de Psychologist, clinical.   El nio se alimenta al menos 8 a 12 veces en 24 horas.  Durante los primeros 3 a 5 das de vida:  Moja 3-5 paales en 24 horas. La materia fecal debe ser blanda y [place].  Tiene al menos 3 a 4 deposiciones en 24 horas. La materia fecal debe ser blanda y [place].  A los 5-7 das de vida, el beb debe tener al menos 3-6 deposiciones en 24 horas. La materia fecal debe ser grumosa y [place] a los 5 [address] de [place].  Su beb tiene una prdida de Psychologist, counselling a 7al 10% durante los primeros 3 [address]  [address] de [address]  El beb no pierde peso despus de 3-7 [address] de [address]  El beb debe aumentar 4 a 6 libras (120 a 170 gr.) por semana despus de los 4 [address] de vida.  Aumenta de [place] a los [address] de vida y vuelve al peso del nacimiento dentro de las 2 semanas. CONGESTIN MAMARIA  Durante la primera semana despus del [place], usted puede experimentar hinchazn en las mamas (congestin [employer]). Al estar congestionadas,  las mamas se sienten pesadas, calientes o sensibles al tacto. El pico de la congestin ocurre a las 24 -48 horas despus del parto.   La congestin puede disminuirse:  Continuando con la Tour manager.  Aumentando la frecuencia.  Tomando duchas calientes o aplicando calor hmedo en los senos antes de cada comida. Esto aumenta la circulacin y [place] a que la [place].   Masajeando suavemente el pecho antes y [place] [employer]. Con las yemas de los dedos, masajee la pared del pecho hacia el pezn en un movimiento circular.   Asegurarse de que el beb vaca al menos uno de sus pechos en cada alimentacin. Tambin ayuda si comienza la siguiente toma en el otro seno.   Extraiga manualmente o con un sacaleches las mamas para vaciar los pechos si el beb tiene sueo o no se aliment bien. Tambin puede extraer la [employer] cuando vuelva a trabajar o si siente que se estn congestionando las [place].  Asegrese de que el beb se prende y est bien colocado durante la Market researcher. Si sigue estas indicaciones, la congestin debe mejorar en 24 a 48 horas. Si an tiene dificultades, consulte a Barista.  CUDESE USTED MISMA  Cuide sus mamas.   Bese o dchese diariamente.   Evite usar [employer].   Use un sostn de soporte Evite el uso de sostenes con aro.  Seque al aire sus pezones durante 3-4 minutos despus de cada comida.   Utilice slo apsitos de algodn en el sostn para absorber las prdidas de [place]. La prdida de un poco de [employer] las comidas es normal.   Use solamente lanolina pura en sus pezones despus de Museum/gallery exhibitions officer. Usted no tiene que lavarla antes de alimentar al beb. Otra opcin es sacarse unas gotas de [place] y [employer] pezones.  Continuar con los autocontroles de la mama. Cudese.   Consuma alimentos saludables. Alterne 3 comidas con 3 colaciones.  Evite los alimentos que usted nota que perjudican al beb.  Dixie Dials, jugos de fruta y agua para Patent examiner su sed (aproximadamente 8 vasos al Futures trader).   Descanse con frecuencia, reljese y tome sus vitaminas prenatales para evitar la fatiga, el estrs y  la anemia.  Evite masticar y fumar tabaco.  Evite el consumo de alcohol y drogas.  Tome medicamentos de venta libre y recetados tal como le indic su mdico o Social research officer, government. Siempre debe consultar con su mdico o farmacutico antes de tomar cualquier medicamento, vitamina o suplemento de hierbas.  Sepa que durante la lactancia puede quedar embarazada. Si lo desea, hable con su mdico acerca de la planificacin familiar y los mtodos anticonceptivos seguros que puede utilizar durante la Market researcher. SOLICITE ATENCIN MDICA SI:   Usted siente que quiere dejar de Museum/gallery exhibitions officer o se siente frustrada con la lactancia.  Siente dolor en los senos o en los pezones.  Sus pezones estn agrietados o Water quality scientist.  Sus pechos estn irritados, sensibles o calientes.  Tiene un rea hinchada en cualquiera de los senos.  Siente escalofros o fiebre.  Tiene nuseas o vmitos.  Observa un drenaje en los pezones.  Sus mamas no se llenan antes de Marine scientist al 5to da despus del [place].  Se siente triste y deprimida.  El nio est demasiado somnoliento como para comer.  El nio tiene problemas para Industrial/product designer.   Moja menos de 3 paales en 24 horas.  Mueve el intestino menos de 3 veces en 24 horas.  La piel del beb o la parte blanca de sus ojos est ms amarilla.   El beb no ha aumentado de [place] a los [address] de [place]. ASEGRESE DE QUE:   Comprende estas instrucciones.  Controlar su enfermedad.  Solicitar ayuda de inmediato si no mejora o si empeora. Document Released: 08/11/2005 Document Revised: 05/05/2012 [facility] Patient Information 2014 [place], [place]. Parto vaginal  (Vaginal Delivery) En Restaurant manager, fast food, su mdico debe estar seguro de que usted est en trabajo de parto. Algunos signos son:  Puede  haber eliminado el "tapn mucoso" antes que comience el trabajo de [place]. Se trata de una pequea cantidad de mucus con sangre.  Tiene contracciones uterinas regulares.  El Bank [employer] las contracciones se acorta.  Las molestias y Chief Technology Officer se hacen gradualmente ms intensos.  El dolor se ubica principalmente en la espalda.  Los dolores empeoran al [employer].  El cuello del tero (la apertura del tero) se hace ms delgada, comienza a borrarse, y se abre (se dilata). Una vez que se encuentre en [place]ump[place] parto y sea admitida en el hospital, el mdico har lo siguiente:  Un examen fsico completo.  Controlar sus signos vitales (presin arterial, pulso, temperatura y la frecuencia cardaca fetal).  Realizar un examen vaginal (usando un guante estril y lubricante) para determinar:  La posicin (presentacin) del beb (ceflica [vertex] o nalgas primero).  El nivel (plano) de la cabeza del beb en el canal de parto.  El borramiento y dilatacin del cuello del tero.  Le rasurarn el vello pbico y le aplicarn una enema segn lo considere el mdico y las circunstancias.  Generalmente se coloca un monitor electrnico sobre el abdomen. El monitor sigue la duracin e intensidad de las contracciones, as como la frecuencia cardaca del beb.  Generalmente, el profesional inserta una va intravenosa en el brazo para administrarle agua azucarada. Esta es una medida de precaucin, de modo que puedan administrarle rpidamente medicamentos durante el [place] de [place]. EL TRABAJO DE PARTO Y PARTO NORMALES SE DIVIDEN EN 3 ETAPAS: Primera etapa Comienzan las contracciones regulares y el cuello comienza a borrarse y dilatarse. Esta etapa puede durar entre 3 y 15 horas. El final de la primera etapa se considera cuando el cuello est borrado en  un 100% y se ha dilatado 10 cm. Le administrarn analgsicos por:  Inyeccin (morfina, demerol, etc.).  Anestesia regional (espinal, caudal o epidural,  anestsicos colocados en diferentes regiones de la columna vertebral). Podrn administrarle medicamentos para el dolor en la regin paracervical, que consiste en la aplicacin de un anestsico inyectable en cada uno de los lados del cuello del tero. La embarazada puede requerir un "parto natural", es decir no recibir medicamentos o anestesia durante el [place] de [place] y [place]. Segunda etapa En este momento el beb baja a travs del canal de parto (vagina) y nace. Esto puede durar entre 1 y 4 horas. A medida que el beb asoma la cabeza por el canal de parto, podr sentir una sensacin similar a cuando mueve el intestino. Sentir el impulse de empujar con fuerza hasta que el nio salga. A medida que la cabecita baja, el mdico decidir si realiza una episiotoma (corte en el perineo y rea de la vagina) para evitar la ruptura de los tejidos. Luego del nacimiento del beb y la expulsin de la placenta, la episiotoma se sutura. En algunos casos se coloca a la madre una mscara con xido nitroso para Research officer, political party respiracin y Engineer, materials. El final de la etapa 2 se produce cuando el beb ha salido completamente. Luego, cuando el cordn umbilical deja de pulsar, se pinza y se corta. Tercera etapa La tercera etapa comienza luego que el beb ha nacido y finaliza luego de la expulsin de la placenta. Generalmente esto lleva entre 5 y 30 minutos. Luego de la expulsin de la placenta, le aplicarn un medicamento por va intravenosa para ayudar a Engineer, materials y Psychiatric nurse. En la tercera etapa no hay dolor y generalmente no son necesarios los analgsicos. Si le han realizado una episiotoma, es el momento de Sales promotion account executive. Luego del parto, la mam es observada y controlada exhaustivamente durante 1  2 horas para verificar que no hay sangrado en el post parto (hemorragias). Si pierde [employer], le administrarn un medicamento para Engineer, manufacturing tero y Comptroller. Document Released:  07/24/2008 Document Revised: 05/05/2012 [facility] Patient Information 2014 [place], [place].

## 2013-02-25 ENCOUNTER — Inpatient Hospital Stay (HOSPITAL_COMMUNITY)
Admission: AD | Admit: 2013-02-25 | Discharge: 2013-02-28 | DRG: 775 | Disposition: A | Discharge status: Home / Self Care | Payer: Medicaid Other | Source: Ambulatory Visit | Attending: Obstetrics & Gynecology | Admitting: Obstetrics & Gynecology

## 2013-02-25 ENCOUNTER — Encounter (HOSPITAL_COMMUNITY): Payer: Self-pay

## 2013-02-25 ENCOUNTER — Encounter (HOSPITAL_COMMUNITY): Payer: Self-pay | Admitting: *Deleted

## 2013-02-25 ENCOUNTER — Inpatient Hospital Stay (EMERGENCY_DEPARTMENT_HOSPITAL)
Admission: AD | Admit: 2013-02-25 | Discharge: 2013-02-25 | Disposition: A | Discharge status: Home / Self Care | Payer: Medicaid Other | Source: Ambulatory Visit | Attending: Obstetrics & Gynecology | Admitting: Obstetrics & Gynecology

## 2013-02-25 ENCOUNTER — Inpatient Hospital Stay (HOSPITAL_COMMUNITY): Payer: Medicaid Other | Admitting: Anesthesiology

## 2013-02-25 ENCOUNTER — Encounter (HOSPITAL_COMMUNITY): Payer: Self-pay | Admitting: Anesthesiology

## 2013-02-25 DIAGNOSIS — Z3490 Encounter for supervision of normal pregnancy, unspecified, unspecified trimester: Secondary | ICD-10-CM

## 2013-02-25 DIAGNOSIS — N12 Tubulo-interstitial nephritis, not specified as acute or chronic: Secondary | ICD-10-CM | POA: Diagnosis present

## 2013-02-25 DIAGNOSIS — O2390 Unspecified genitourinary tract infection in pregnancy, unspecified trimester: Secondary | ICD-10-CM

## 2013-02-25 DIAGNOSIS — R8281 Pyuria: Secondary | ICD-10-CM

## 2013-02-25 DIAGNOSIS — Z3493 Encounter for supervision of normal pregnancy, unspecified, third trimester: Secondary | ICD-10-CM

## 2013-02-25 DIAGNOSIS — R82998 Other abnormal findings in urine: Secondary | ICD-10-CM

## 2013-02-25 DIAGNOSIS — O411090 Infection of amniotic sac and membranes, unspecified, unspecified trimester, not applicable or unspecified: Secondary | ICD-10-CM

## 2013-02-25 LAB — CBC
HCT: 37.6 % (ref 36.0–46.0)
Hemoglobin: 12.9 g/dL (ref 12.0–15.0)
MCHC: 34.3 g/dL (ref 30.0–36.0)
MCV: 83.9 fL (ref 78.0–100.0)
RDW: 14.3 % (ref 11.5–15.5)

## 2013-02-25 MED ORDER — WITCH HAZEL-GLYCERIN EX PADS: 1.0000 "application " | MEDICATED_PAD | CUTANEOUS | Status: DC | PRN

## 2013-02-25 MED ORDER — PHENYLEPHRINE 40 MCG/ML (10ML) SYRINGE FOR IV PUSH (FOR BLOOD PRESSURE SUPPORT)
80.0000 ug | PREFILLED_SYRINGE | INTRAVENOUS | Status: DC | PRN
  Administered 2013-02-25: 10:00:00 via INTRAVENOUS
  Filled 2013-02-25: qty 5

## 2013-02-25 MED ORDER — EPHEDRINE 5 MG/ML INJ: 10.0000 mg | INTRAVENOUS | Status: DC | PRN

## 2013-02-25 MED ORDER — LACTATED RINGERS IV SOLN: 500.0000 mL | Freq: Once | INTRAVENOUS | Status: DC

## 2013-02-25 MED ORDER — TETANUS-DIPHTH-ACELL PERTUSSIS 5-2.5-18.5 LF-MCG/0.5 IM SUSP: 0.5000 mL | Freq: Once | INTRAMUSCULAR | Status: DC

## 2013-02-25 MED ORDER — SIMETHICONE 80 MG PO CHEW: 80.0000 mg | CHEWABLE_TABLET | ORAL | Status: DC | PRN

## 2013-02-25 MED ORDER — OXYTOCIN BOLUS FROM INFUSION: 500.0000 mL | INTRAVENOUS | Status: DC

## 2013-02-25 MED ORDER — OXYTOCIN 40 UNITS IN LACTATED RINGERS INFUSION - SIMPLE MED
62.5000 mL/h | INTRAVENOUS | Status: DC
  Administered 2013-02-25: 500 mL/h via INTRAVENOUS
  Filled 2013-02-25: qty 1000

## 2013-02-25 MED ORDER — IBUPROFEN 600 MG PO TABS: 600.0000 mg | ORAL_TABLET | Freq: Four times a day (QID) | ORAL | Status: DC | PRN

## 2013-02-25 MED ORDER — SODIUM BICARBONATE 8.4 % IV SOLN
INTRAVENOUS | Status: DC | PRN
  Administered 2013-02-25: 5 mL via EPIDURAL

## 2013-02-25 MED ORDER — CEPHALEXIN 500 MG PO CAPS: 500.0000 mg | ORAL_CAPSULE | Freq: Two times a day (BID) | ORAL | Status: DC

## 2013-02-25 MED ORDER — FLEET ENEMA 7-19 GM/118ML RE ENEM: 1.0000 | ENEMA | RECTAL | Status: DC | PRN

## 2013-02-25 MED ORDER — OXYCODONE-ACETAMINOPHEN 5-325 MG PO TABS
1.0000 | ORAL_TABLET | ORAL | Status: DC | PRN
  Administered 2013-02-26 – 2013-02-27 (×3): 1 via ORAL
  Filled 2013-02-25 (×3): qty 1

## 2013-02-25 MED ORDER — LACTATED RINGERS IV SOLN
INTRAVENOUS | Status: DC
  Administered 2013-02-25: 1000 mL via INTRAVENOUS

## 2013-02-25 MED ORDER — LANOLIN HYDROUS EX OINT: TOPICAL_OINTMENT | CUTANEOUS | Status: DC | PRN

## 2013-02-25 MED ORDER — ONDANSETRON HCL 4 MG/2ML IJ SOLN: 4.0000 mg | Freq: Four times a day (QID) | INTRAMUSCULAR | Status: DC | PRN

## 2013-02-25 MED ORDER — DIPHENHYDRAMINE HCL 50 MG/ML IJ SOLN: 12.5000 mg | INTRAMUSCULAR | Status: DC | PRN

## 2013-02-25 MED ORDER — OXYCODONE-ACETAMINOPHEN 5-325 MG PO TABS: 1.0000 | ORAL_TABLET | ORAL | Status: DC | PRN

## 2013-02-25 MED ORDER — SENNOSIDES-DOCUSATE SODIUM 8.6-50 MG PO TABS
2.0000 | ORAL_TABLET | Freq: Every day | ORAL | Status: DC
  Administered 2013-02-25 – 2013-02-27 (×3): 2 via ORAL

## 2013-02-25 MED ORDER — ZOLPIDEM TARTRATE 5 MG PO TABS: 5.0000 mg | ORAL_TABLET | Freq: Every evening | ORAL | Status: DC | PRN

## 2013-02-25 MED ORDER — LACTATED RINGERS IV SOLN
500.0000 mL | INTRAVENOUS | Status: DC | PRN
  Administered 2013-02-25 (×2): 1000 mL via INTRAVENOUS

## 2013-02-25 MED ORDER — IBUPROFEN 600 MG PO TABS
600.0000 mg | ORAL_TABLET | Freq: Four times a day (QID) | ORAL | Status: DC
  Administered 2013-02-25 – 2013-02-28 (×11): 600 mg via ORAL
  Filled 2013-02-25 (×12): qty 1

## 2013-02-25 MED ORDER — PRENATAL MULTIVITAMIN CH
1.0000 | ORAL_TABLET | Freq: Every day | ORAL | Status: DC
  Administered 2013-02-26 – 2013-02-28 (×3): 1 via ORAL
  Filled 2013-02-25 (×3): qty 1

## 2013-02-25 MED ORDER — BENZOCAINE-MENTHOL 20-0.5 % EX AERO
1.0000 "application " | INHALATION_SPRAY | CUTANEOUS | Status: DC | PRN
  Administered 2013-02-25 – 2013-02-28 (×2): 1 via TOPICAL
  Filled 2013-02-25 (×2): qty 56

## 2013-02-25 MED ORDER — EPHEDRINE 5 MG/ML INJ
10.0000 mg | INTRAVENOUS | Status: DC | PRN
  Filled 2013-02-25: qty 4

## 2013-02-25 MED ORDER — DIBUCAINE 1 % RE OINT: 1.0000 "application " | TOPICAL_OINTMENT | RECTAL | Status: DC | PRN

## 2013-02-25 MED ORDER — CITRIC ACID-SODIUM CITRATE 334-500 MG/5ML PO SOLN: 30.0000 mL | ORAL | Status: DC | PRN

## 2013-02-25 MED ORDER — ONDANSETRON HCL 4 MG/2ML IJ SOLN: 4.0000 mg | INTRAMUSCULAR | Status: DC | PRN

## 2013-02-25 MED ORDER — ACETAMINOPHEN 325 MG PO TABS: 650.0000 mg | ORAL_TABLET | ORAL | Status: DC | PRN

## 2013-02-25 MED ORDER — FENTANYL 2.5 MCG/ML BUPIVACAINE 1/10 % EPIDURAL INFUSION (WH - ANES)
14.0000 mL/h | INTRAMUSCULAR | Status: DC | PRN
  Administered 2013-02-25: 14 mL/h via EPIDURAL
  Filled 2013-02-25: qty 125

## 2013-02-25 MED ORDER — PHENYLEPHRINE 40 MCG/ML (10ML) SYRINGE FOR IV PUSH (FOR BLOOD PRESSURE SUPPORT): 80.0000 ug | PREFILLED_SYRINGE | INTRAVENOUS | Status: DC | PRN

## 2013-02-25 MED ORDER — DIPHENHYDRAMINE HCL 25 MG PO CAPS: 25.0000 mg | ORAL_CAPSULE | Freq: Four times a day (QID) | ORAL | Status: DC | PRN

## 2013-02-25 MED ORDER — LIDOCAINE HCL (PF) 1 % IJ SOLN
30.0000 mL | INTRAMUSCULAR | Status: AC | PRN
  Administered 2013-02-25: 30 mL via SUBCUTANEOUS
  Filled 2013-02-25 (×2): qty 30

## 2013-02-25 MED ORDER — ONDANSETRON HCL 4 MG PO TABS: 4.0000 mg | ORAL_TABLET | ORAL | Status: DC | PRN

## 2013-02-25 NOTE — MAU Note (Signed)
Contractions and urge to push since 20 minutes ago. Denies leaking of fluid or vaginal bleeding. Positive fetal movement.

## 2013-02-25 NOTE — Progress Notes (Signed)
Amber Thomas is a 27 y.o. G1P0000 at [redacted]w[redacted]d admitted for active labor, rupture of membranes  Subjective: Patient is sleeping comfortably  Objective: BP 107/63  Pulse 125  Temp(Src) 98.4 F (36.9 C) (Oral)  Resp 20  Ht 5\' 3"  (1.6 m)  Wt 68.947 kg (152 lb)  BMI 26.93 kg/m2  SpO2 98%  LMP 05/25/2012      FHT:  FHR: 150 bpm, variability: minimal ,  accelerations:  Present,  decelerations:  Absent UC:   regular, every 3 minutes SVE:   Dilation: Lip/rim Effacement (%): 100 Station: +2 Exam by:: Dherr rn  Labs: Lab Results  Component Value Date   WBC 21.9* 02/25/2013   HGB 12.9 02/25/2013   HCT 37.6 02/25/2013   MCV 83.9 02/25/2013   PLT 318 02/25/2013    Assessment / Plan: Spontaneous labor, progressing normally  Labor: Progressing normally Preeclampsia:  No signs Fetal Wellbeing:  Category I Pain Control:  Labor support without medications I/D:  n/a Anticipated MOD:  NSVD  Tawni Carnes 02/25/2013, 1:21 PM

## 2013-02-25 NOTE — H&P (Signed)
Amber Thomas is a 27 y.o. female presenting for active labor. Maternal Medical History:  Reason for admission: Rupture of membranes.   Contractions: Onset was more than 2 days ago.   Frequency: regular.   Duration is approximately 4 minutes.   Perceived severity is moderate.    Fetal activity: Perceived fetal activity is normal.   Last perceived fetal movement was within the past hour.    Prenatal Complications - Diabetes: none.    3 visits to MAU in the past few days for contractions and small amount of bleeding since Monday.  OB History   Grav Para Term Preterm Abortions TAB SAB Ect Mult Living   1 0 0 0 0 0 0 0 0 0      Past Medical History  Diagnosis Date  . Chlamydia 2011  . Fracture, ankle     left- as child, no problems   Past Surgical History  Procedure Laterality Date  . No past surgeries     Family History: family history includes Diabetes in her paternal grandmother and Thyroid disease in her sister.  There is no history of Hearing loss. Social History:  reports that she has never smoked. She has never used smokeless tobacco. She reports that she does not drink alcohol or use illicit drugs.   Prenatal Transfer Tool  Maternal Diabetes: No Genetic Screening: Normal Maternal Ultrasounds/Referrals: Normal Fetal Ultrasounds or other Referrals:  None Maternal Substance Abuse:  No Significant Maternal Medications:  None Significant Maternal Lab Results:  None Other Comments:  None  Review of Systems  Constitutional: Negative for fever and chills.    Dilation: Lip/rim Effacement (%): 100 Station: +2 Exam by:: Dherr rn Blood pressure 107/63, pulse 125, temperature 98.4 F (36.9 C), temperature source Oral, resp. rate 20, height 5\' 3"  (1.6 m), weight 68.947 kg (152 lb), last menstrual period 05/25/2012, SpO2 98.00%. Maternal Exam:  Uterine Assessment: Contraction strength is moderate.  Contraction duration is 4 minutes. Contraction  frequency is regular.   Abdomen: Patient reports no abdominal tenderness. Fundal height is consistent with GA.       Fetal Exam Fetal Monitor Review: Baseline rate: 150.  Variability: minimal (<5 bpm).   Pattern: accelerations present and no decelerations.    Fetal State Assessment: Category I - tracings are normal.     Physical Exam  Constitutional: She is oriented to person, place, and time. She appears well-developed and well-nourished.  HENT:  Head: Normocephalic and atraumatic.  Cardiovascular: Normal rate, regular rhythm and normal heart sounds.   GI: Soft.  Neurological: She is alert and oriented to person, place, and time.    Prenatal labs: ABO, Rh: O/POS/-- (02/05 1139) Antibody: NEG (02/05 1139) Rubella: 8.10 (02/05 1139) RPR: NON REAC (04/02 1541)  HBsAg: NEGATIVE (02/05 1139)  HIV: NON REACTIVE (04/02 1541)  GBS: Negative (06/11 0000)   Assessment/Plan: Spontaneous onset of labor, membrane ruptured. Expect SVD   Tawni Carnes 02/25/2013, 1:12 PM Evaluation and management procedures were performed by Resident physician under my supervision/collaboration. Chart reviewed, patient examined by me and I agree with management and plan.

## 2013-02-25 NOTE — MAU Provider Note (Signed)
History     CSN: [id number]  Arrival date and time: 02/25/13 0029   None     Chief Complaint  Patient presents with  . Labor Eval   HPI  27 y.o. G1P0000 at [redacted]w[redacted]d who has had bloody show and increasingly painful contractions over the last 3 days who has been to the MAU a few times over the past few days and was 2cm dilated earlier today and 100% effaced but very posterior, with continued increased contractions and wanting cervical check. Pt denies vaginal bleeding or change in fetal movement or gush of fluid.  Past Medical History  Diagnosis Date  . Chlamydia 2011  . Fracture, ankle     left- as child, no problems    Past Surgical History  Procedure Laterality Date  . No past surgeries      Family History  Problem Relation Age of Onset  . Thyroid disease Sister   . Diabetes Paternal Grandmother     History  Substance Use Topics  . Smoking status: Never Smoker   . Smokeless tobacco: Never Used  . Alcohol Use: No    Allergies: No Known Allergies  Prescriptions prior to admission  Medication Sig Dispense Refill  . zolpidem (AMBIEN CR) 12.5 MG CR tablet Take 1 tablet (12.5 mg total) by mouth at bedtime as needed for sleep.  6 tablet  0  . mupirocin cream (BACTROBAN) 2 % Apply topically 3 (three) times daily.  15 g  0  . Prenatal Vit-Fe Fumarate-FA (PRENATAL MULTIVITAMIN) TABS Take 1 tablet by mouth daily at 12 noon.        ROS Per HPI Physical Exam   Last menstrual period 05/25/2012.  Physical Exam  Constitutional: She appears well-developed and well-nourished.  HENT:  Head: Normocephalic and atraumatic.  Eyes: Conjunctivae and EOM are normal.  Neck: Normal range of motion. Neck supple.  Cardiovascular: Normal rate, regular rhythm and normal heart sounds.   Respiratory: Effort normal and breath sounds normal. No respiratory distress.  Genitourinary: Vagina normal. No vaginal discharge found.  Musculoskeletal: Normal range of motion. She exhibits no edema.   Neurological: She is alert.  Skin: Skin is warm.  Psychiatric: She has a normal mood and affect. Her behavior is normal.     Exam: 2/100/0 but more anterior than this morning  2 hours later: 2/100/0 with head well-applied  Results for orders placed in visit on 02/24/13 (from the past 48 hour(s))  POCT URINALYSIS DIP (DEVICE)     Status: Abnormal   Collection Time    02/24/13  9:37 AM      Result Value Range   Glucose, UA NEGATIVE  NEGATIVE mg/dL   Bilirubin Urine NEGATIVE  NEGATIVE   Ketones, ur NEGATIVE  NEGATIVE mg/dL   Specific Gravity, Urine 1.020  1.005 - 1.030   Hgb urine dipstick SMALL (*) NEGATIVE   pH 7.0  5.0 - 8.0   Protein, ur NEGATIVE  NEGATIVE mg/dL   Urobilinogen, UA 0.2  0.0 - 1.0 mg/dL   Nitrite POSITIVE (*) NEGATIVE   Leukocytes, UA TRACE (*) NEGATIVE   Comment: Biochemical Testing Only. Please order routine urinalysis from main lab if confirmatory testing is needed.     MAU Course  Procedures  MDM:  Patient was the same exam on initial check and on check 2 hours later. She was noted to have positive nitrites and leukocytes on wet prep yesterday  Assessment and Plan  26 y.o. G1P0000 at [redacted]w[redacted]d here with contractions. - No  cervical change. Reassure - For presumed UTI (UA positive for nitrites 3 days ago), keflex BID x 7 days and stay hydrated; declined pyridium - Wet prep given reported vaginal discharge  - Pt declined membrane stripping   Simone Curia 02/25/2013, 2:43 AM   I saw and examined patient and agree with above resident note. I reviewed history, imaging, labs, and vitals. I personally reviewed the fetal heart tracing, and it is reactive (initially minimal variability but improved with moderate variability and accels). Napoleon Form, MD

## 2013-02-25 NOTE — Discharge Summary (Signed)
Obstetric Discharge Summary Reason for Admission: onset of labor and rupture of membranes Prenatal Procedures: NST Intrapartum Procedures: spontaneous vaginal delivery Postpartum Procedures: none Complications-Operative and Postpartum: 2nd degree perineal laceration Hemoglobin  Date Value Range Status  02/25/2013 12.9  12.0 - 15.0 g/dL Final     HCT  Date Value Range Status  02/25/2013 37.6  36.0 - 46.0 % Final  Hospital Course: Amber Thomas is a 27 y.o. female presenting for active labor.  Reason for admission: Rupture of membranes.   Contractions: Onset was more than 2 days ago.  Frequency: regular.  Duration is approximately 4 minutes.  Perceived severity is moderate.  Fetal activity: Perceived fetal activity is normal.  Last perceived fetal movement was within the past hour.  Prenatal Complications - Diabetes: none.  3 visits to MAU in the past few days for contractions and small amount of bleeding since Monday.   Delivery Note  At 3:56 PM a viable and healthy female was delivered via (Presentation: LOA; ). APGAR: 9, 9; weight pending.  Placenta status: spontaneous, intact . Cord: 3 vessels with the following complications: none. Cord pH: pending  Anesthesia: Epidural  Episiotomy: none  Lacerations: 2nd degree;Perineal  Suture Repair: 3.0 vicryl rapide  Est. Blood Loss (mL): 300  Mom to postpartum. Baby to nursery-stable.  Tawni Carnes  02/25/2013, 4:24 PM  She has done well with recovery in the postpartum period. She is deemed ready for early discharge.   Physical Exam:  General: alert, cooperative and no distress Lochia: appropriate Uterine Fundus: firm Incision: healing well DVT Evaluation: No evidence of DVT seen on physical exam.  Discharge Diagnoses: Term Pregnancy-delivered  Discharge Information: Date: 02/25/2013 Activity: pelvic rest Diet: routine Medications: Ibuprofen Condition: stable and improved Instructions: refer to practice  specific booklet Discharge to: home   Newborn Data: Live born female  Birth Weight: 6 lb 11.2 oz (3040 g) APGAR: 9, 9  Home with mother.  Amber Thomas 02/25/2013, 10:09 PM

## 2013-02-25 NOTE — MAU Note (Signed)
Pt complaining of pressure, wanting to push.  Pt examined, swelling noted in ant cervix.  Head is low, no membranes felt, light brown meconium noted on glove.  Pt educated and encouraged not to push- causing swelling of cervix.

## 2013-02-25 NOTE — Anesthesia Procedure Notes (Signed)

## 2013-02-25 NOTE — Anesthesia Preprocedure Evaluation (Addendum)

## 2013-02-25 NOTE — Progress Notes (Addendum)
Patient ID: Amber Thomas, female   DOB: 10-08-1985, 27 y.o.   MRN: [id number] Amber Thomas is a 27 y.o. G1P0000 at [redacted]w[redacted]d admitted for Spontaneous onset of labor at term, prolonged latent phase, light meconium-stained amniotic fluid  Subjective: Comfortable. No urge to push  Objective: BP 98/52  Pulse 129  Temp(Src) 101.2 F (38.4 C) (Oral)  Resp 20  Ht 5\' 3"  (1.6 m)  Wt 152 lb (68.947 kg)  BMI 26.93 kg/m2  SpO2 98%  LMP 05/25/2012 oral temp 101.1  Fetal Heart FHR: 175-180 bpm, variability: moderate,  accelerations:  Abscent,  decelerations:  Absent   Contractions: q2-3  SVE:   Dilation: Lip/rim Effacement (%): 100 Station: +2 Exam by:: Doak Mah cnm Cervix complete LOA to OA +3   Assessment / Plan:  Labor: 2nd stage, chorioamnionitis> start pushing, O2, IVF bolus, Tylenol 1000 mg pr Fetal Wellbeing: Cat 2 Pain Control:  adequate Expected mode of delivery: NSVD imminent  Amber Thomas 02/25/2013, 3:03 PM

## 2013-02-25 NOTE — MAU Note (Signed)
Contractions for past 4 days.  2nd night with out sleep. ? Leaking at 0730

## 2013-02-25 NOTE — MAU Note (Signed)
Pad on, small - clear area of wetness noted on pad, small amt of lt brown mec noted also

## 2013-02-26 LAB — CBC
MCHC: 33.3 g/dL (ref 30.0–36.0)
Platelets: 235 10*3/uL (ref 150–400)
RDW: 14.7 % (ref 11.5–15.5)
WBC: 15.6 10*3/uL — ABNORMAL HIGH (ref 4.0–10.5)

## 2013-02-26 MED ORDER — IBUPROFEN 600 MG PO TABS: 600.0000 mg | ORAL_TABLET | Freq: Four times a day (QID) | ORAL | Status: DC

## 2013-02-26 NOTE — Progress Notes (Signed)
Explained to pt. Reasons for limiting formula use during hospital stay to help get breastfeeding established. FOB says they don't like the shield and want to use formula.  Encouraged pt. To use electric pump even if she doesn't see colostrum.  This RN assisted with hand expression pt responded with discomfort.  This RN provided formula per pts. Request.  Interpreter at bedside to answer questions and do some teaching.

## 2013-02-26 NOTE — Anesthesia Postprocedure Evaluation (Signed)
Anesthesia Post Note  Patient: Amber Thomas  Procedure(s) Performed: * No procedures listed *  Anesthesia type: Epidural  Patient location: Mother/Baby  Post pain: Pain level controlled  Post assessment: Post-op Vital signs reviewed  Last Vitals:  Filed Vitals:   02/26/13 0709  BP: 92/57  Pulse: 106  Temp: 36.6 C  Resp: 18    Post vital signs: Reviewed  Level of consciousness:alert  Complications: No apparent anesthesia complications

## 2013-02-27 LAB — CULTURE, OB URINE: Colony Count: >=100000

## 2013-02-27 MED ORDER — OXYTOCIN 40 UNITS IN LACTATED RINGERS INFUSION - SIMPLE MED
INTRAVENOUS | Status: AC
  Filled 2013-02-27: qty 1000

## 2013-02-27 MED ORDER — CEFTRIAXONE SODIUM 1 G IJ SOLR
1.0000 g | Freq: Two times a day (BID) | INTRAMUSCULAR | Status: DC
  Administered 2013-02-27 – 2013-02-28 (×3): 1 g via INTRAMUSCULAR
  Filled 2013-02-27 (×5): qty 10

## 2013-02-27 NOTE — Lactation Note (Signed)
This note was copied from the chart of Amber Thomas. Lactation Consultation Note: Mom has been giving bottles of formula through the night. Family member interpreting for me. She reports that she has no milk- even when pumping. Encouraged to either nurse the baby or pump to promote milk supply. Has DEBP set up in room but reports that she pumped yesterday but none today. Baby under phototherapy now. Offered assist with pumping but mom refuses at present.  To call for assist prn.  Patient Name: Amber Thomas [id number] Date: 02/27/2013 Reason for consult: Follow-up assessment   Maternal Data    Feeding    LATCH Score/Interventions                      Lactation Tools Discussed/Used     Consult Status Consult Status: Follow-up Date: 02/28/13 Follow-up type: In-patient    Pamelia Hoit 02/27/2013, 10:27 AM

## 2013-02-27 NOTE — Progress Notes (Signed)
Post Partum Day 2 Subjective: Eating, drinking, voiding, ambulating well.  Lochia and pain wnl.  Denies dizziness, lightheadedness, or sob. Does have Rt sided flank pain.  Objective: Blood pressure 80/48, pulse 120, temperature 99.6 F (37.6 C), temperature source Oral, resp. rate 24, height 5\' 3"  (1.6 m), weight 68.947 kg (152 lb), last menstrual period 05/25/2012, SpO2 98.00%, unknown if currently breastfeeding.  Physical Exam:  General: alert, cooperative and no distress Lochia: appropriate Uterine Fundus: firm, non-tender Incision: n/a DVT Evaluation: No evidence of DVT seen on physical exam. Negative Homan's sign. No cords or calf tenderness. No significant calf/ankle edema. Skin feels hot to touch + Rt CVAT   Recent Labs  02/25/13 0835 02/26/13 0605  HGB 12.9 11.5*  HCT 37.6 34.5*   02/24/13 UA: +nitrites, trace leukocytes   Preliminary culture: e-coli, untreated WBC: 21.9-->15.6  Assessment/Plan: Rt sided pyelonephritis- discussed w/ Dr. Despina Hidden, to pend d/c, give Rocephin 1gm IM q 12hr (NSL already out) Breast/bottlefeeding Undecided about contraception- contemplating nexplanon vs. Pop's   LOS: 2 days   Marge Duncans 02/27/2013, 7:32 AM

## 2013-02-27 NOTE — Discharge Summary (Signed)
Attestation of Attending Supervision of Advanced Practitioner (CNM/NP): Evaluation and management procedures were performed by the Advanced Practitioner under my supervision and collaboration. I have reviewed the Advanced Practitioner's note and chart, and I agree with the management and plan.  Seren Chaloux H. 11:28 AM   

## 2013-02-28 ENCOUNTER — Telehealth: Payer: Self-pay | Admitting: *Deleted

## 2013-02-28 MED ORDER — POLYETHYLENE GLYCOL 3350 17 G PO PACK: 17.0000 g | PACK | Freq: Two times a day (BID) | ORAL | Status: DC

## 2013-02-28 MED ORDER — CEPHALEXIN 500 MG PO CAPS: 500.0000 mg | ORAL_CAPSULE | Freq: Two times a day (BID) | ORAL | Status: DC

## 2013-02-28 NOTE — Progress Notes (Signed)
UR chart review completed.  

## 2013-02-28 NOTE — Progress Notes (Signed)
Stopped by to check on patient. 

## 2013-02-28 NOTE — Telephone Encounter (Signed)
Message copied by Jill Side on Mon Feb 28, 2013  8:57 AM ------      Message from: Reva Bores      Created: Sat Feb 26, 2013 12:05 PM       Has e.coli--is now s/p delivery.  Lets call in Macrobid 100mg  bid x 7 days--will need spanish interpreter. ------

## 2013-02-28 NOTE — Discharge Summary (Signed)
Obstetric Discharge Summary - ADDENDUM TO PREVIOUSLY FILED DISCHARGE SUMMARY in pt who developed pyelonephritis prior to discharge Reason for Admission: onset of labor and rupture of membranes  Prenatal Procedures: NST  Intrapartum Procedures: spontaneous vaginal delivery  Postpartum Procedures: none  Complications-Operative and Postpartum: 2nd degree perineal laceration Hemoglobin  Date Value Range Status  02/26/2013 11.5* 12.0 - 15.0 g/dL Final     HCT  Date Value Range Status  02/26/2013 34.5* 36.0 - 46.0 % Final  Prenatal complications - Diabetes: none. Prior to delivery: 3 visits to MAU in the past few days for contractions and small amount of bleeding since Monday.   Delivery Note  At 3:56 PM a viable and healthy female was delivered via (Presentation: LOA; ). APGAR: 9, 9; weight pending.  Placenta status: spontaneous, intact . Cord: 3 vessels with the following complications: none. Cord pH: pending  Anesthesia: Epidural  Episiotomy: none  Lacerations: 2nd degree;Perineal  Suture Repair: 3.0 vicryl rapide  Est. Blood Loss (mL): 300  Mom to postpartum. Baby to nursery-stable.  Tawni Carnes  02/25/2013, 4:24 PM  She has done well with recovery in the postpartum period. She is deemed ready for early discharge.  Physical Exam:  General: alert, cooperative, appears stated age and no distress Lochia: appropriate Uterine Fundus: firm DVT Evaluation: No evidence of DVT seen on physical exam. No cords or calf tenderness. No significant calf/ankle edema.  Urine culture: >=100,000 COLONIES/ML e coli, pan-sensitive except to nitrofurantoin  Discharge Diagnoses: Term Pregnancy-delivered and pyelonephritis  Discharge Information: Date: 02/28/2013 Activity: pelvic rest Diet: routine Medications: Ibuprofen and miralax Condition: stable Instructions: refer to practice specific booklet Discharge to: home Follow-up Information   Follow up with [facility]. Schedule an  appointment as soon as possible for a visit in 6 weeks. (for your postpartum visit)    Contact information:   [address] [place] [address] [phone]    Breast and bottle feeding Right-sided pyelonephritis - s/p 2 doses rocephin 1gm IM (IV already out) q12 hours, will continue keflex x 9 days and discharge today  Contraception - contemplating nexplanon but with lots of questions today  Newborn Data: Live born female  Birth Weight: 6 lb 11.2 oz (3040 g) APGAR: 9, 9  Home with mother.  Simone Curia 02/28/2013, 6:45 AM

## 2013-03-01 NOTE — H&P (Signed)
Attestation of Attending Supervision of Advanced Practitioner (CNM/NP): Evaluation and management procedures were performed by the Advanced Practitioner under my supervision and collaboration. I have reviewed the Advanced Practitioner's note and chart, and I agree with the management and plan.  Johany Hansman H. 3:55 PM   

## 2013-03-02 ENCOUNTER — Telehealth: Payer: Self-pay | Admitting: *Deleted

## 2013-03-02 NOTE — Telephone Encounter (Addendum)
Message copied by Jill Side on Wed Mar 02, 2013  2:34 PM ------      Message from: Reva Bores      Created: Sat Feb 26, 2013 12:05 PM       Has e.coli--is now s/p delivery.  Lets call in Macrobid 100mg  bid x 7 days--will need spanish interpreter. ------ Called pt w/[employer] interpreter and informed her that she has a bladder infection which requires treatment with antibiotic. She reports that she was given Rx for antibiotic on 7/7 and is taking it. I advised pt to continue as prescribed. I also informed her that someone will be calling her with a clinic appt for post partum follow up.  Pt voiced understanding.

## 2013-03-28 ENCOUNTER — Encounter: Payer: Self-pay | Admitting: Family Medicine

## 2013-03-28 ENCOUNTER — Ambulatory Visit (INDEPENDENT_AMBULATORY_CARE_PROVIDER_SITE_OTHER): Payer: Self-pay | Admitting: Family Medicine

## 2013-03-28 DIAGNOSIS — N898 Other specified noninflammatory disorders of vagina: Secondary | ICD-10-CM

## 2013-03-28 MED ORDER — NORGESTIMATE-ETH ESTRADIOL 0.25-35 MG-MCG PO TABS: 1.0000 | ORAL_TABLET | Freq: Every day | ORAL | Status: DC

## 2013-03-28 NOTE — Patient Instructions (Signed)
Informacin sobre los [employer]  (Oral Contraception Information) Los anticonceptivos orales son medicamentos que se utilizan para Location manager. Su funcin es [employer] ovarios liberen vulos. Las hormonas de los anticonceptivos orales hacen que el moco cervical se haga ms espeso, lo que evita que el esperma ingrese al tero. Tambin hacen que la membrana que tapiza el tero se vuelva ms fina, lo que no permite que el huevo fertilizado se adhiera a la pared del tero. Los anticonceptivos orales son muy efectivos cuando se toman exactamente como se prescriben. Sin embargo, no previenen contra las enfermedades de transmisin sexual (ETS). La prctica del sexo seguro, como el uso de preservativos, junto con la [place], [place] a prevenir ese tipo de enfermedades. Antes de tomar la pldora, usted debe hacerse un examen fsico y un test de Pap. El mdico podr indicar anlisis de sangre si es necesario. El mdico se asegurar de que usted es una buena candidata para usar anticonceptivos orales. Converse con su mdico acerca de los posibles efectos secundarios de los anticonceptivos [place]. Cuando se inicia el uso de anticonceptivos [place], se pueden tomar durante 2 a 3 meses para que el cuerpo se adapte a los cambios en los niveles hormonales en el cuerpo.  HAY DOS TIPOS DE ANTICONCEPTIVOS ORALES  La pldora combinada. Esta pldora contiene las hormonas estrgeno y progestina (progesterona sinttica). La pldora combinada viene en envases para [address] o [address]. En los envases para [address], usted no tomar las pldoras durante 7 [address]  [address] despus de la ltima pldora. En los envases para [address], la pldora se toma [employer]. Las ltimas 7 no contienen hormonas. Ciertos tipos de pldoras tienen ms de 21 pldoras que contienen hormonas.  La minipldora. Esta pldora contiene la hormona progesterona solamente. Es necesario tomarla todos los das de [place] continua. Viene en envases de 91  pldoras. Las primeras 84 contienen hormonas y las 7 ltimas no contienen. En los ltimos 7 das usted tendr su perodo menstrual. Puede experimentar manchado irregular. VENTAJAS   Disminuye los sntomas premenstruales.  Se [place] para tratar los [employer].  Regula el ciclo menstrual.  Disminuye el ciclo menstrual abundante.  Trata el acn.  Trata hemorragias uterinas anormales.  Trata el dolor plvico crnico.  Trata el sndrome ovrico poliqustico.  Trata la endometriosis.  Pueden usarse como anticonceptivos de Sport and exercise psychologist  Pueden ser menos efectivos si:   Olvid tomar la [employer] a la misma hora.  Usted tiene una enfermedad estomacal o intestinal que disminuye la absorcin de la pldora.  Toma anticonceptivos orales junto con otros frmacos que [employer] anticonceptivos sean menos eficaces.  Usted toma anticonceptivos orales que han vencido.  Cuando se [place] el envase de [place], se olvida de recomenzar el uso [employer] 7. Document Released: 05/21/2005 Document Revised: 11/03/2011 [facility] Patient Information 2014 [place], [place].

## 2013-03-28 NOTE — Progress Notes (Signed)
Patient ID: Amber Thomas, female   DOB: 16-Aug-1986, 27 y.o.   MRN: [id number] Subjective:    Amber Thomas is a 27 y.o. G83P1001 Hispanic female who presents for a postpartum visit. She is 4 weeks postpartum following a spontaneous vaginal delivery. I have fully reviewed the prenatal and intrapartum course. The delivery was at term gestational weeks. Outcome: spontaneous vaginal delivery. Anesthesia: epidural. Postpartum course has been complicated by pyelo s/p abx. Baby's course has been uncomplicated. Baby is feeding by both breast and bottle - gerber goodstart. Bleeding no bleeding. Bowel function is normal. Bladder function is normal. Patient is not sexually active. Contraception method would like  OCP (estrogen/progesterone). Postpartum depression screening: negative. Has good support at home.  The following portions of the patient's history were reviewed and updated as appropriate: allergies, current medications, past medical history, past surgical history and problem list.  Review of Systems Pertinent items are noted in HPI.   Filed Vitals:   03/28/13 1339  BP: 105/64  Pulse: 72  Temp: 98.9 F (37.2 C)  TempSrc: Oral  Height: 5\' 3"  (1.6 m)  Weight: 136 lb (61.689 kg)    Objective:     General:  alert, cooperative and no distress   Breasts:  deferred, no complaints  Lungs: clear to auscultation bilaterally  Heart:  regular rate and rhythm  Abdomen: soft, nontender   Vulva: normal  Vagina: normal vagina  Cervix:  Closed  Corpus: Well-involuted  Adnexa:  Non-palpable  Rectal Exam: no hemorrhoids        Assessment:   Normal postpartum exam 4 wks s/p SVD Depression screening Contraception counseling   Plan:    Contraception: OCP (estrogen/progesterone). To start after her first period. Discussed importance of taking regularly and not missing doses.   Doing well postpartum Wet prep sent due to amount of discharge  Follow up in: 1  year for routine gyn or as needed.

## 2013-03-29 LAB — WET PREP, GENITAL
Clue Cells Wet Prep HPF POC: NONE SEEN
Yeast Wet Prep HPF POC: NONE SEEN

## 2014-02-07 IMAGING — US US FETAL BPP W/O NONSTRESS
1 series · 13 of 13 positions shown · non-contrast
Comparison: 12/01/2012.

CLINICAL DATA: Decreased fetal motion.

OBSTETRIC <14 WK ULTRASOUND
TECHNIQUE: Biophysical profile only.

[Series 1: us fetal bpp w/o nonstress · non-contrast · 13 acquisitions, 13 frames shown]
[im 1/13]
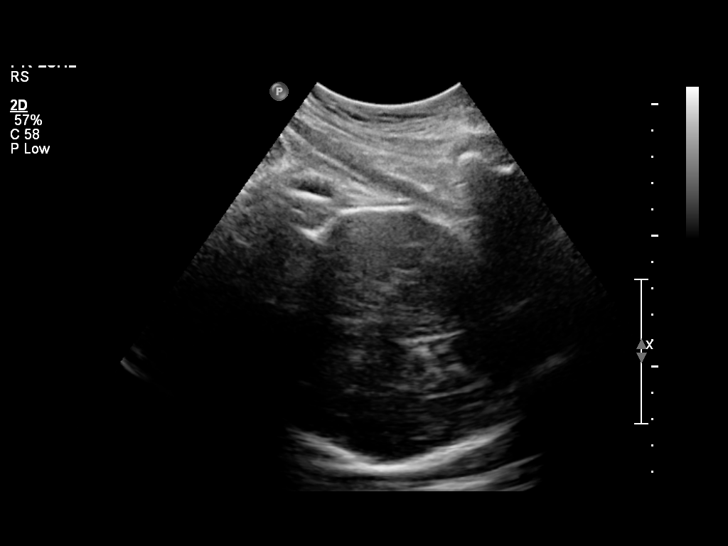
[im 2/13]
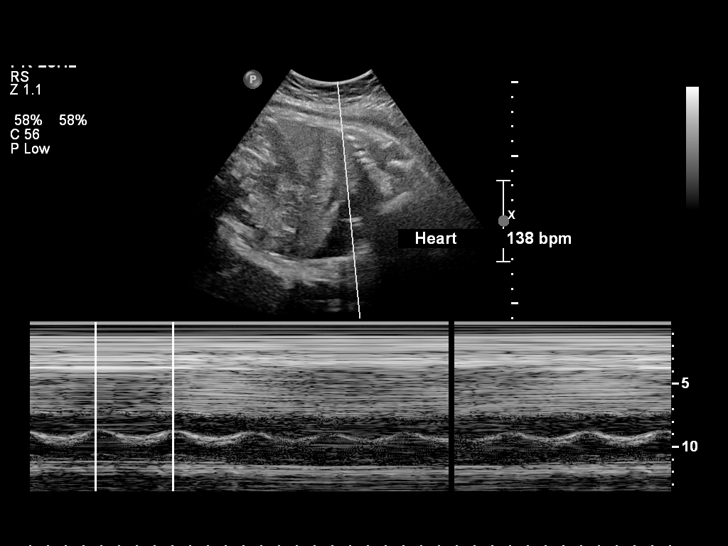
[im 3/13]
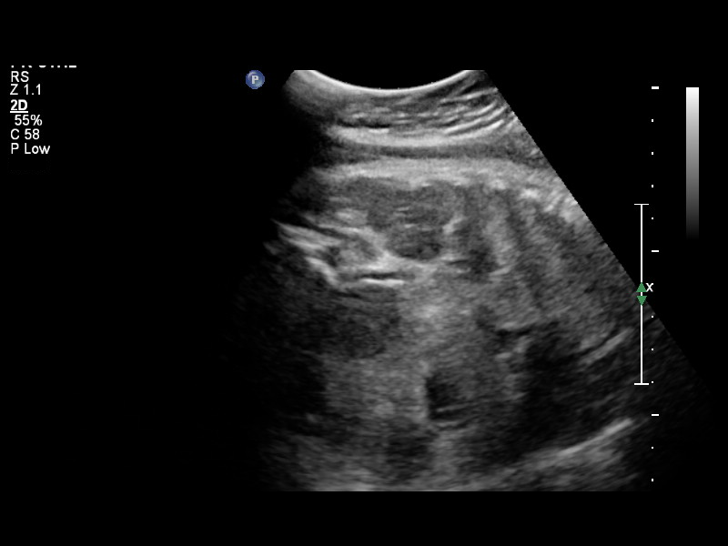
[im 4/13]
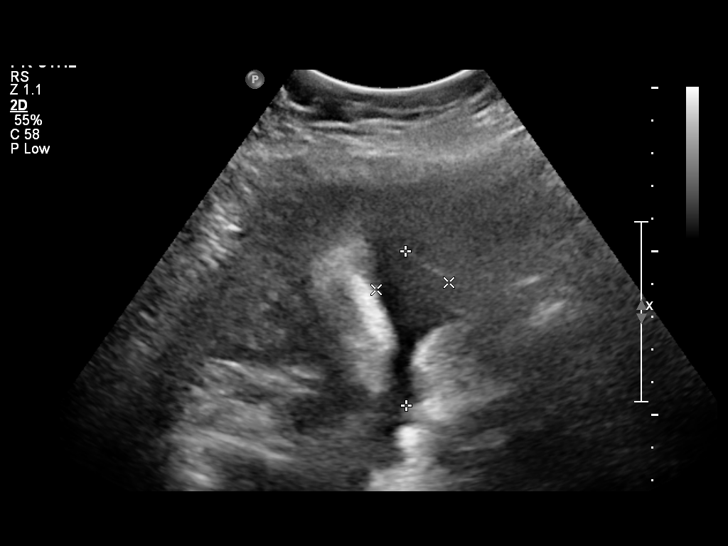
[im 5/13]
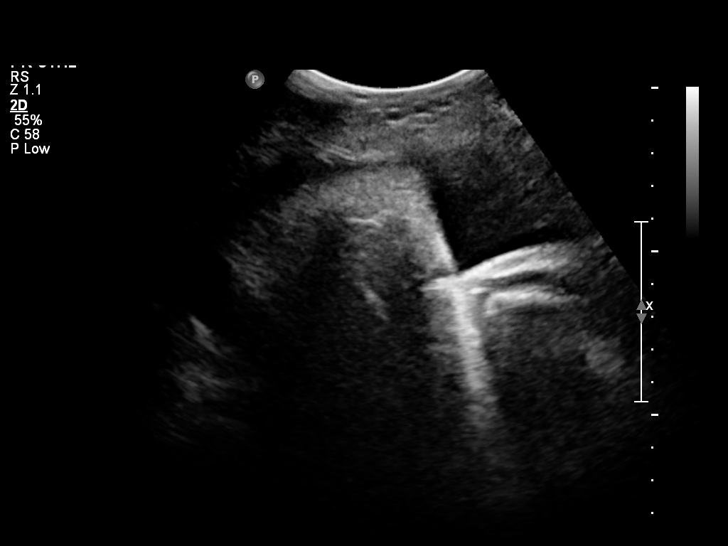
[im 6/13]
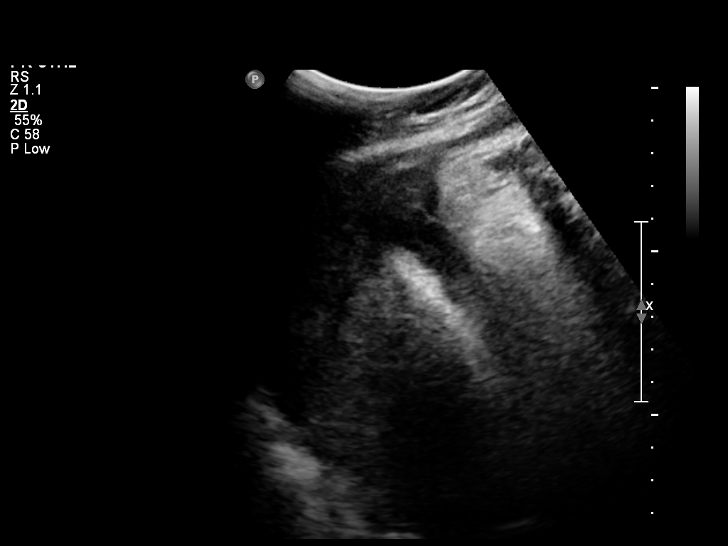
[im 7/13]
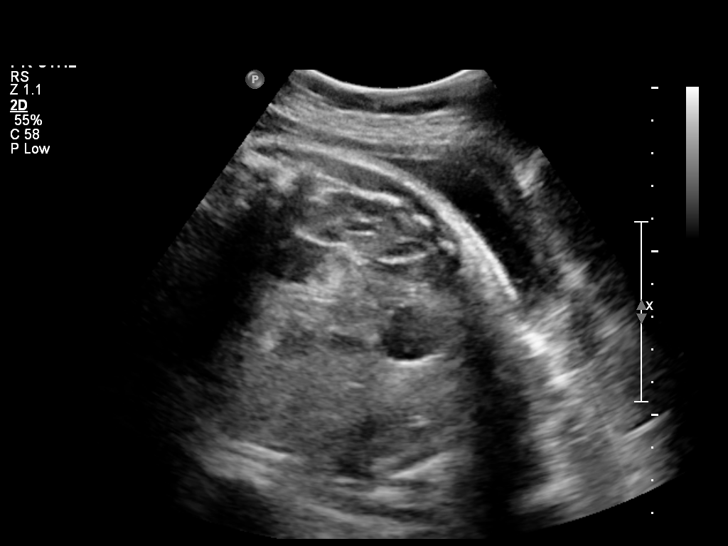
[im 8/13]
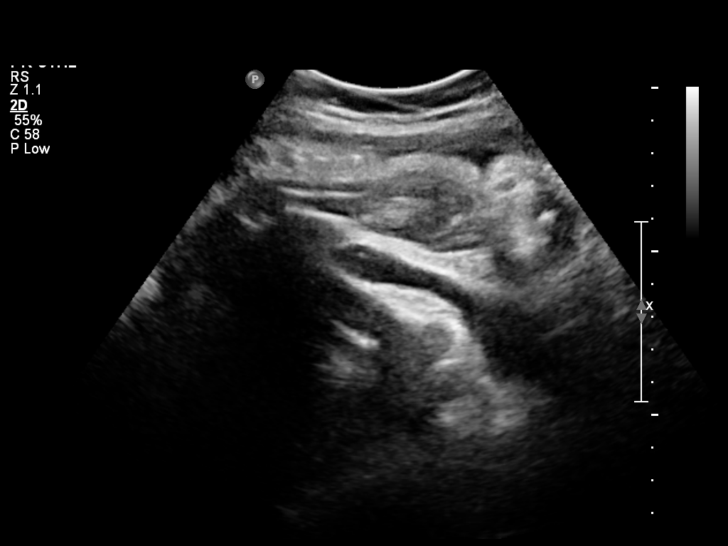
[im 9/13]
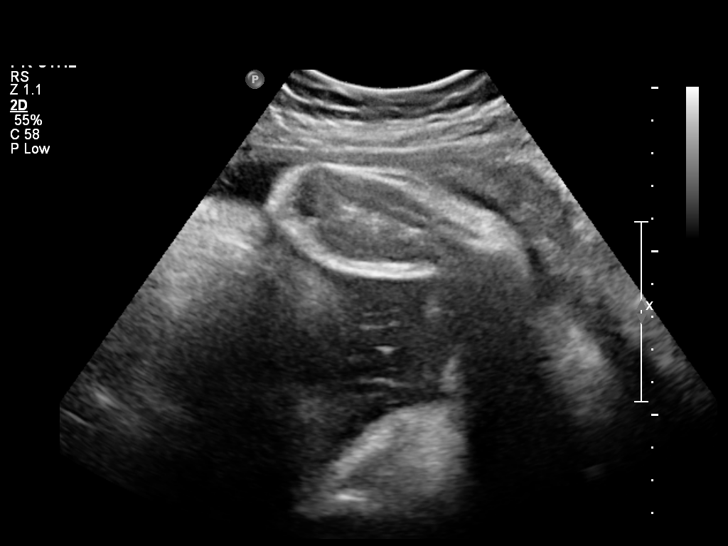
[im 10/13]
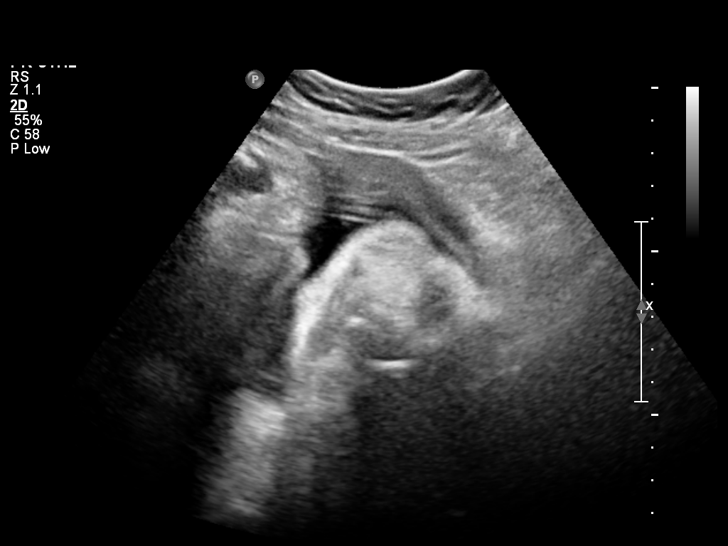
[im 11/13]
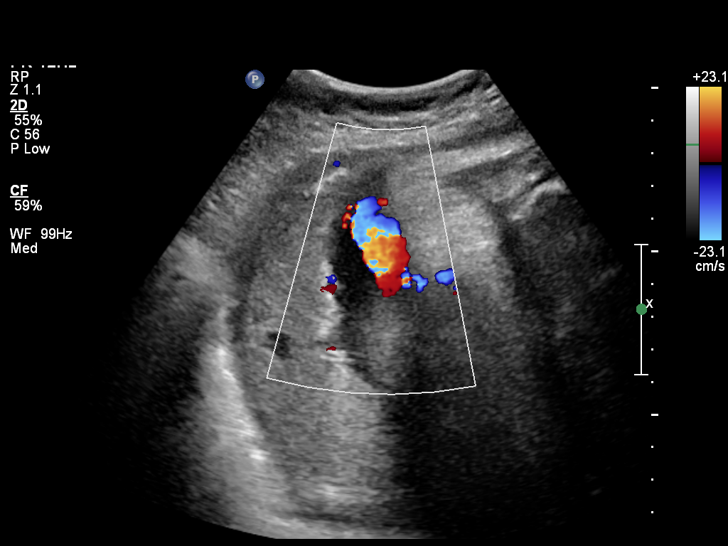
[im 12/13]
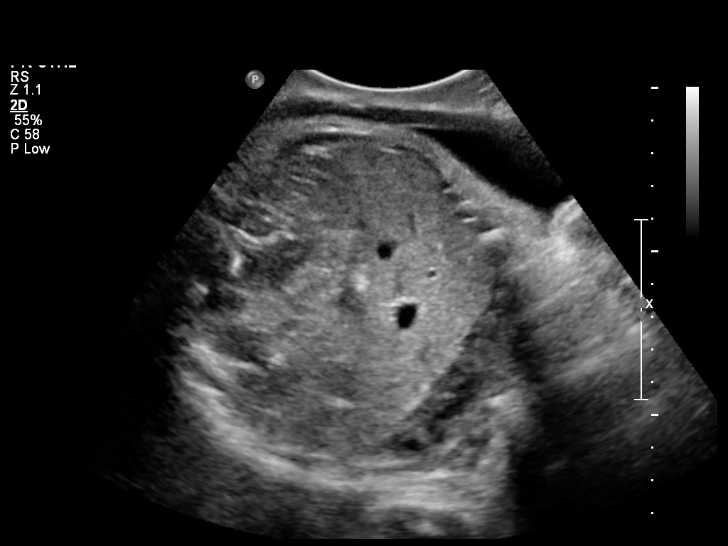
[im 13/13]
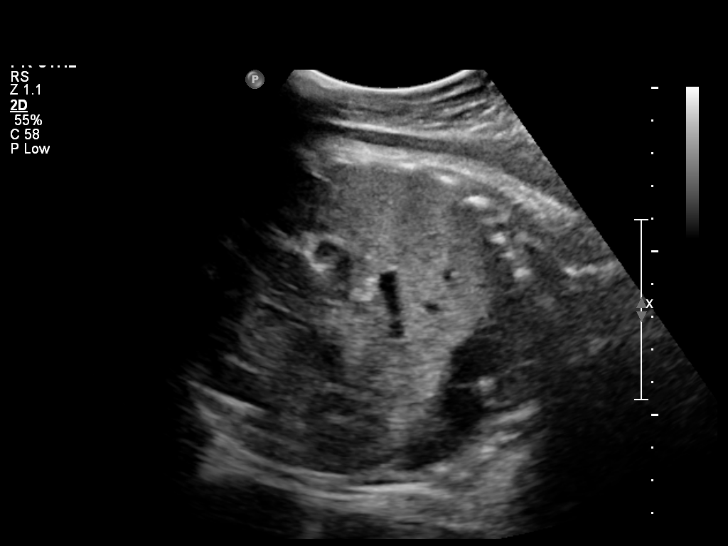

[13 of 13 positions shown; findings below may reference images not displayed]

Present examination was requested as a biophysical profile only.

Heart rate 138.

Cephalic lie.

Largest vertical pocket 4.7 centimeters.

[DATE] for fetal motion, breathing, tone and amniotic fluid
with a total score of [DATE].
IMPRESSION: [DATE] biophysical profile.

Interrogation of fetal anatomy was not performed at the current
time as this was not requested.

## 2014-03-24 ENCOUNTER — Telehealth: Payer: Self-pay | Admitting: Obstetrics and Gynecology

## 2014-03-24 DIAGNOSIS — Z3049 Encounter for surveillance of other contraceptives: Secondary | ICD-10-CM

## 2014-03-24 MED ORDER — NORGESTIMATE-ETH ESTRADIOL 0.25-35 MG-MCG PO TABS: 1.0000 | ORAL_TABLET | Freq: Every day | ORAL | Status: DC

## 2014-03-24 NOTE — Telephone Encounter (Signed)
Patient requesting refill on her birth control. Rx sent in per protocol. Patient understand that she need to make appointment for annual exam before more refills.

## 2014-04-03 ENCOUNTER — Telehealth: Payer: Self-pay | Admitting: General Practice

## 2014-04-03 NOTE — Telephone Encounter (Signed)
Patient called in to front office stating she went by the pharmacy yesterday and was told her Rx for the birth control pills was canceled. Used Amber Thomas for interpreter. Called [facility] and they have the Rx and will fill it now. Informed patient. Patient verbalized understanding and had no questions. Patient knows she needs appt to be seen for future refills, advised patient to seek out [facility] for contraception in the future as she does not have insurance. Patient verbalized understanding to all had no other questions

## 2014-06-26 ENCOUNTER — Encounter: Payer: Self-pay | Admitting: Family Medicine

## 2017-05-19 ENCOUNTER — Other Ambulatory Visit: Payer: Self-pay | Admitting: Nurse Practitioner

## 2017-05-19 DIAGNOSIS — N63 Unspecified lump in unspecified breast: Secondary | ICD-10-CM

## 2017-05-27 ENCOUNTER — Ambulatory Visit
Admission: RE | Admit: 2017-05-27 | Discharge: 2017-05-27 | Disposition: A | Discharge status: Home / Self Care | Payer: No Typology Code available for payment source | Source: Ambulatory Visit | Attending: Nurse Practitioner | Admitting: Nurse Practitioner

## 2017-05-27 ENCOUNTER — Other Ambulatory Visit (HOSPITAL_COMMUNITY): Payer: Self-pay | Admitting: *Deleted

## 2017-05-27 DIAGNOSIS — N63 Unspecified lump in unspecified breast: Secondary | ICD-10-CM

## 2017-05-27 DIAGNOSIS — N6452 Nipple discharge: Secondary | ICD-10-CM

## 2017-05-27 HISTORY — DX: Nipple discharge: N64.52

## 2017-05-28 ENCOUNTER — Encounter (HOSPITAL_COMMUNITY): Payer: Self-pay

## 2017-05-28 ENCOUNTER — Other Ambulatory Visit: Payer: Self-pay | Admitting: Obstetrics and Gynecology

## 2017-05-28 ENCOUNTER — Other Ambulatory Visit (HOSPITAL_COMMUNITY): Payer: Self-pay | Admitting: *Deleted

## 2017-05-28 ENCOUNTER — Ambulatory Visit (HOSPITAL_COMMUNITY)
Admission: RE | Admit: 2017-05-28 | Discharge: 2017-05-28 | Disposition: A | Discharge status: Home / Self Care | Payer: Self-pay | Source: Ambulatory Visit | Attending: Obstetrics and Gynecology | Admitting: Obstetrics and Gynecology

## 2017-05-28 ENCOUNTER — Ambulatory Visit
Admission: RE | Admit: 2017-05-28 | Discharge: 2017-05-28 | Disposition: A | Discharge status: Home / Self Care | Payer: No Typology Code available for payment source | Source: Ambulatory Visit | Attending: Obstetrics and Gynecology | Admitting: Obstetrics and Gynecology

## 2017-05-28 VITALS — BP 92/66 | Temp 98.0°F | Ht 64.25 in | Wt 133.0 lb

## 2017-05-28 DIAGNOSIS — N6452 Nipple discharge: Secondary | ICD-10-CM

## 2017-05-28 DIAGNOSIS — N6320 Unspecified lump in the left breast, unspecified quadrant: Secondary | ICD-10-CM

## 2017-05-28 DIAGNOSIS — N63 Unspecified lump in unspecified breast: Secondary | ICD-10-CM

## 2017-05-28 DIAGNOSIS — Z1239 Encounter for other screening for malignant neoplasm of breast: Secondary | ICD-10-CM

## 2017-05-28 NOTE — Patient Instructions (Signed)
Explained breast self awareness with Ma. De Jesus Mahala Menghini. Patient did not need a Pap smear today due to last Pap smear was in September 2018 per patient. Let her know BCCCP will cover Pap smears every 3 years unless has a history of abnormal Pap smears. Referred patient to the [facility] for diagnostic mammogram per recommendation. Appointment scheduled for Thursday, May 28, 2017 at 1050.   Ma. Tommi Rumps Jesus Violanda Bobeck verbalized understanding.  Marshaun Lortie, Kathaleen Maser, RN 9:41 AM

## 2017-05-28 NOTE — Progress Notes (Signed)
Complaints of right breast bloody spontaneous nipple discharge. Patient stated she was having right breast pain that resolved 2 weeks ago. Patient had a right breast ultrasound completed 05/27/2017 at the [facility] that a diagnostic mammogram is recommended.  Pap Smear:  Pap smear not completed today. Last Pap smear was in September 2018 at the [facility] and normal. Per patient has no history of an abnormal Pap smear. No Pap smear results are in EPIC.  Physical exam: Breasts Breasts symmetrical. No skin abnormalities bilateral breasts. No nipple retraction bilateral breasts. No nipple discharge bilateral breasts. Unable to express any nipple discharge from the right breast on exam. No lymphadenopathy. No lumps palpated left breast. Palpated a lump versus glandular tissue within the right breast at 4 o'clock 3 cm from the nipple. Patient complained of tenderness when palpated area within the right breast with questionable lump. Referred patient to the [facility] for diagnostic mammogram per recommendation. Appointment scheduled for Thursday, May 28, 2017 at 1050.        Pelvic/Bimanual No Pap smear completed today since last Pap smear was in September 2018 per patient. Pap smear not indicated per BCCCP guidelines.   Smoking History: Patient has never smoked.  Patient Navigation: Patient education provided. Access to services provided for patient through [facility] program. Spanish interpreter provided.  Used Spanish interpreter [employer] from [place].

## 2017-05-29 ENCOUNTER — Encounter (HOSPITAL_COMMUNITY): Payer: Self-pay | Admitting: *Deleted

## 2017-06-08 ENCOUNTER — Other Ambulatory Visit: Payer: Self-pay | Admitting: Surgery

## 2017-12-01 ENCOUNTER — Other Ambulatory Visit: Payer: Self-pay | Admitting: Obstetrics and Gynecology

## 2017-12-01 ENCOUNTER — Ambulatory Visit
Admission: RE | Admit: 2017-12-01 | Discharge: 2017-12-01 | Disposition: A | Discharge status: Home / Self Care | Payer: No Typology Code available for payment source | Source: Ambulatory Visit | Attending: Obstetrics and Gynecology | Admitting: Obstetrics and Gynecology

## 2017-12-01 DIAGNOSIS — N6002 Solitary cyst of left breast: Secondary | ICD-10-CM

## 2017-12-01 DIAGNOSIS — N63 Unspecified lump in unspecified breast: Secondary | ICD-10-CM

## 2018-05-17 ENCOUNTER — Other Ambulatory Visit (HOSPITAL_COMMUNITY): Payer: Self-pay | Admitting: *Deleted

## 2018-05-17 DIAGNOSIS — N6320 Unspecified lump in the left breast, unspecified quadrant: Secondary | ICD-10-CM

## 2018-06-03 ENCOUNTER — Other Ambulatory Visit: Payer: No Typology Code available for payment source

## 2018-07-29 ENCOUNTER — Encounter (HOSPITAL_COMMUNITY): Payer: Self-pay

## 2018-07-29 ENCOUNTER — Encounter (HOSPITAL_COMMUNITY): Payer: Self-pay | Admitting: *Deleted

## 2018-07-29 ENCOUNTER — Ambulatory Visit (HOSPITAL_COMMUNITY)
Admission: RE | Admit: 2018-07-29 | Discharge: 2018-07-29 | Disposition: A | Discharge status: Home / Self Care | Payer: No Typology Code available for payment source | Source: Ambulatory Visit | Attending: Obstetrics and Gynecology | Admitting: Obstetrics and Gynecology

## 2018-07-29 ENCOUNTER — Ambulatory Visit: Payer: No Typology Code available for payment source

## 2018-07-29 VITALS — BP 104/64 | Wt 137.0 lb

## 2018-07-29 DIAGNOSIS — N6320 Unspecified lump in the left breast, unspecified quadrant: Secondary | ICD-10-CM

## 2018-07-29 DIAGNOSIS — N6314 Unspecified lump in the right breast, lower inner quadrant: Secondary | ICD-10-CM

## 2018-07-29 DIAGNOSIS — Z1239 Encounter for other screening for malignant neoplasm of breast: Secondary | ICD-10-CM

## 2018-07-29 NOTE — Patient Instructions (Signed)
Explained breast self awareness with [name]. Patient did not need a Pap smear today due to last Pap smear was in September 2018 per patient. Let her know BCCCP will cover Pap smears every 3 years unless has a history of abnormal Pap smears. [facility] for diagnostic mamm[facility] and possible left breast ultrasound per recommendation. Appointment scheduled for Tuesday, August 10, 2018 at 1010. Patient aware of appointment and will be there. [name] verbalized understanding.  Miciah Shealy, Kathaleen Maserhristine Poll, RN 9:41 AM

## 2018-07-29 NOTE — Progress Notes (Signed)
Patient referred to BCCCP due to recommending 2111-month left breast ultrasound. Last left breast ultrasound completed 12/01/2017. Patient complained of a right breat lump since 07/27/2018 that was [facility].  Pap Smear: Pap smear not completed today. Last Pap smear was in September 2018 [facility] and normal. Per patient has no history of an abnormal Pap smear. No Pap smear results are in EPIC.  Physical exam: Breasts Breasts symmetrical. No skin abnormalities bilateral breasts. No nipple retraction bilateral breasts. No nipple discharge bilateral breasts. No lymphadenopathy. Palpated two lumps within the left breast at 10 o'clock 3 cm from the nipple and 10:30 o'clock 2 cm from the nipple. Palpated a lump within the right breast at 4 o'clock 3 cm from the nipple. Previous note from 05/28/2017 a lump was palpated within the same area on exam. No complaints of pain or tenderness on exam. [facility] for diagnostic mammogram and possible left breast ultrasound per recommendation. Appointment scheduled for Tuesday, August 10, 2018 at 1010.        Pelvic/Bimanual No Pap smear completed today since last Pap smear was in September 2018 per patient. Pap smear not indicated per BCCCP guidelines.    Smoking History: Patient has never smoked.  Patient Navigation: Patient education provided. Access to services provided [facility]. Spanish interpreter provided.   Breast and Cervical Cancer Risk Assessment: Patient has no family history of breast cancer, known genetic mutations, or radiation treatment to the chest before age 30. Patient has no history of cervical dysplasia, immunocompromised, or DES exposure in-utero. Breast Cancer risk assessment completed. No breast cancer risk calculated due to patient is less than 30 years old.  Used Spanish interpreter [employer]rika McReynolds from  [place].

## 2018-08-10 ENCOUNTER — Other Ambulatory Visit: Payer: No Typology Code available for payment source

## 2018-09-27 ENCOUNTER — Other Ambulatory Visit: Payer: No Typology Code available for payment source

## 2018-09-27 ENCOUNTER — Ambulatory Visit
Admission: RE | Admit: 2018-09-27 | Discharge: 2018-09-27 | Disposition: A | Discharge status: Home / Self Care | Payer: No Typology Code available for payment source | Source: Ambulatory Visit | Attending: Obstetrics and Gynecology | Admitting: Obstetrics and Gynecology

## 2018-09-27 ENCOUNTER — Ambulatory Visit
Admission: RE | Admit: 2018-09-27 | Discharge: 2018-09-27 | Disposition: A | Discharge status: Home / Self Care | Payer: Self-pay | Source: Ambulatory Visit | Attending: Obstetrics and Gynecology | Admitting: Obstetrics and Gynecology

## 2018-09-27 DIAGNOSIS — N6320 Unspecified lump in the left breast, unspecified quadrant: Secondary | ICD-10-CM

## 2019-09-18 IMAGING — US ULTRASOUND LEFT BREAST LIMITED
1 series · 8 of 8 positions shown · non-contrast
Comparison: Previous exam(s).

CLINICAL DATA: Short-term follow-up left breast masses

EXAM:
ULTRASOUND OF THE LEFT BREAST

[Series 1: ultrasound left breast limited · 0.04mm/px · 8 of 8 slices shown]
[im 1/8]
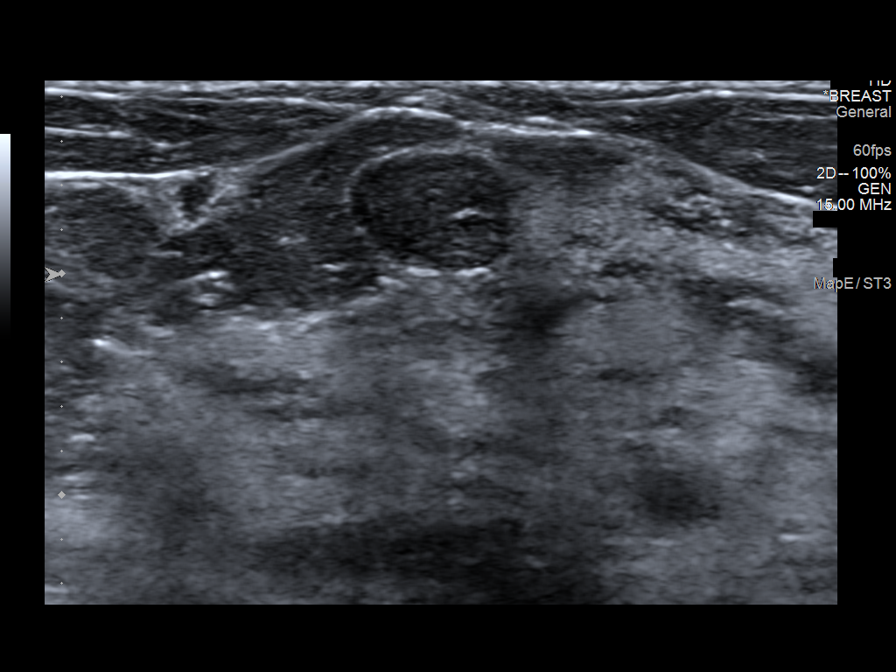
[im 2/8]
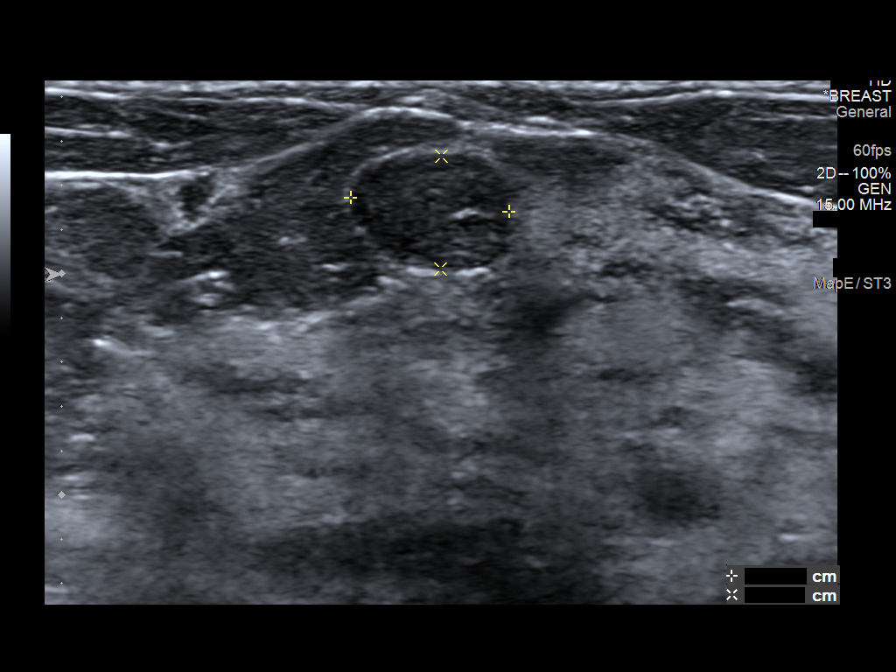
[im 3/8]
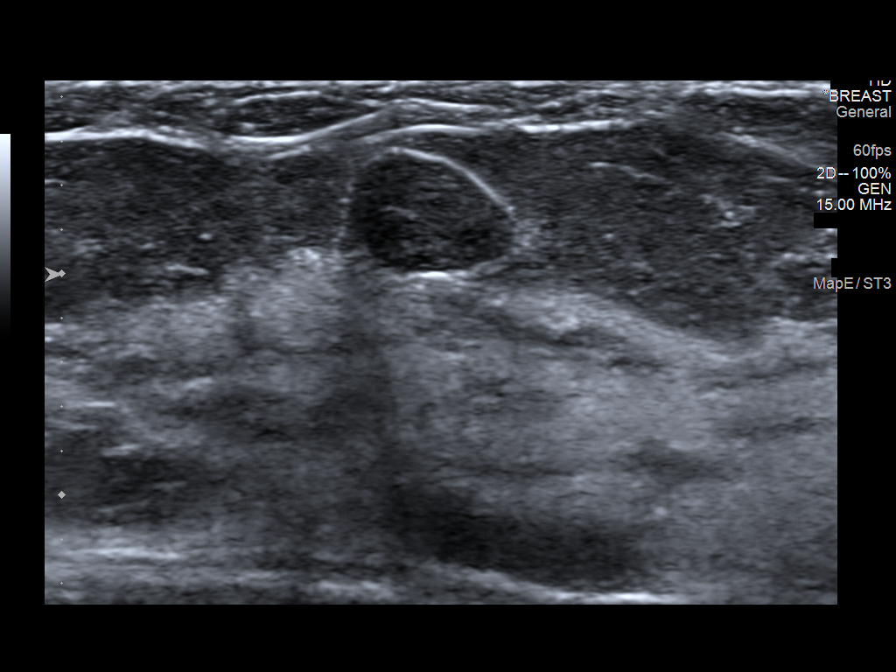
[im 4/8]
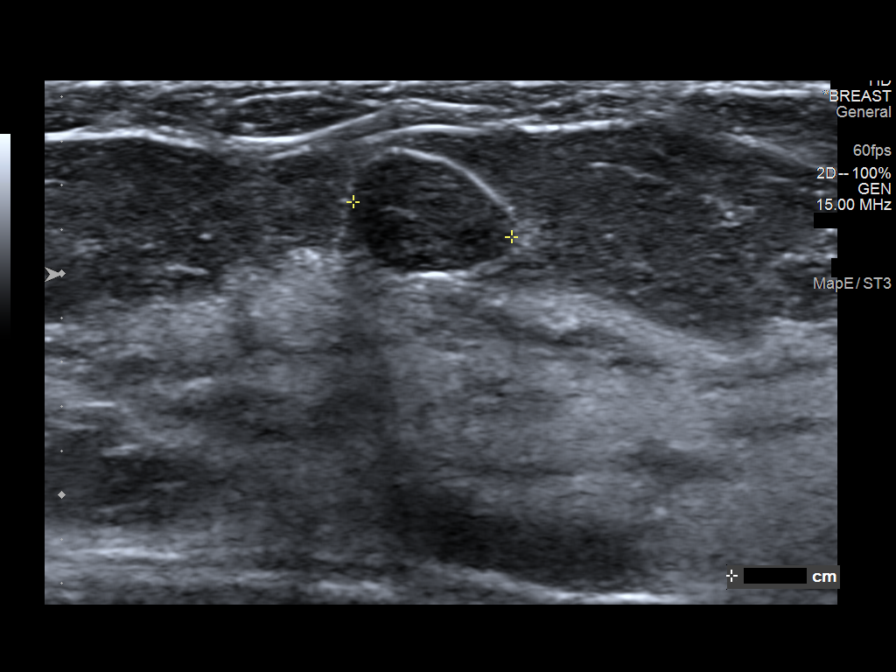
[im 5/8]
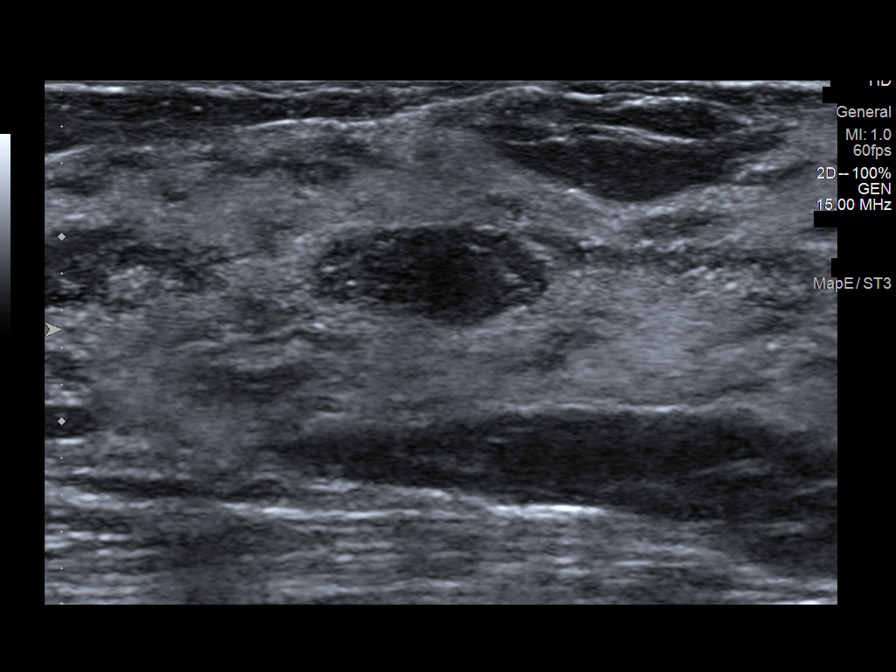
[im 6/8]
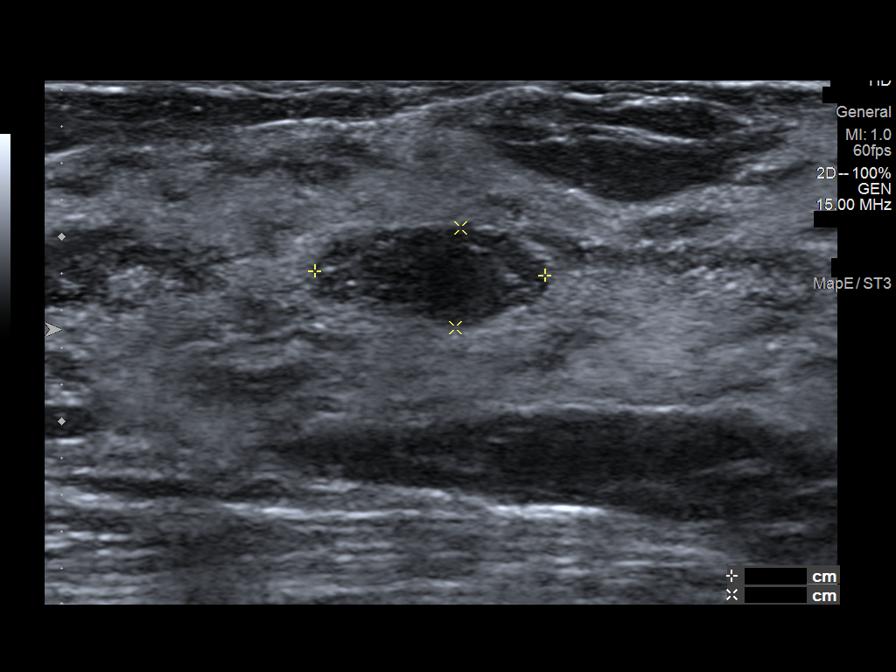
[im 7/8]
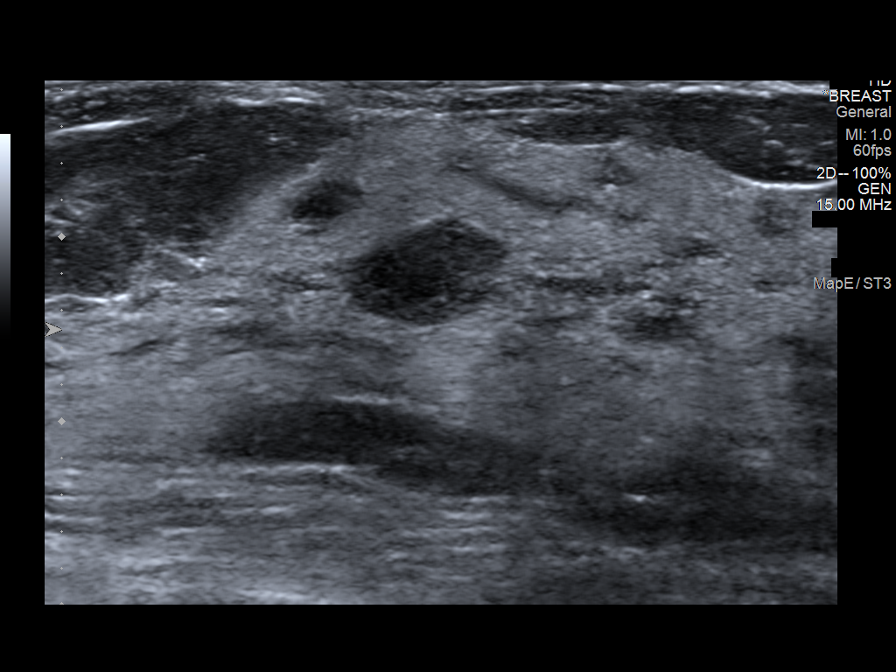
[im 8/8]
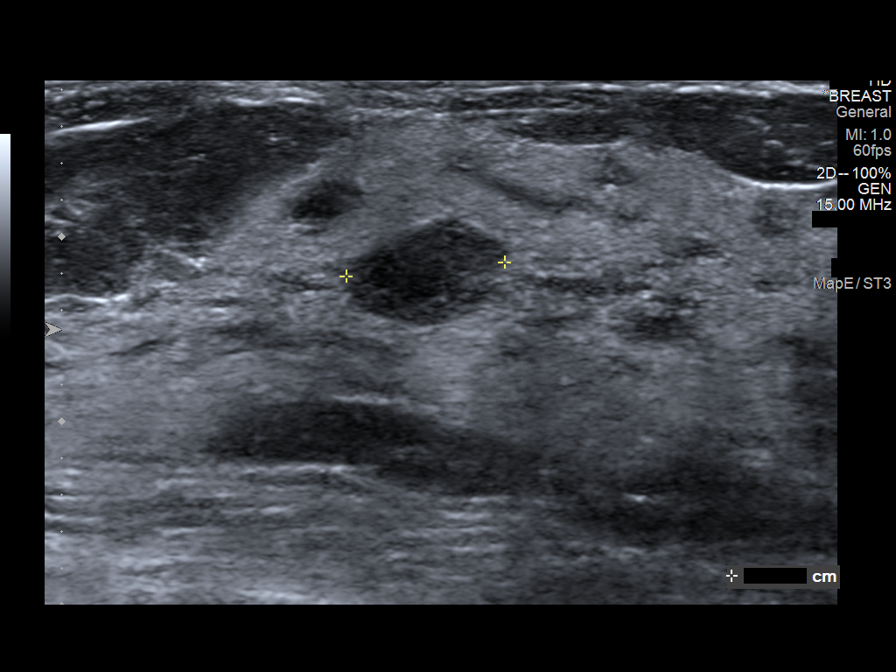

[8 of 8 positions shown; findings below may reference images not displayed]

FINDINGS: Targeted ultrasound is performed, showing stable oval hypoechoic
lesion at left breast 10 o'clock 3 cm from nipple measuring 0.7 x
0.5 x 0.7 cm. At the left breast 10:30 o'clock 2 cm from nipple,
there is a stable oval hypoechoic lesion measuring 1.3 x 0.5 x
cm. These are probably fibroadenomas.
IMPRESSION: Probable benign findings.

RECOMMENDATION:
Twelve month follow-up ultrasound left breast.

I have discussed the findings and recommendations with the patient.
Results were also provided in writing at the conclusion of the
visit. If applicable, a reminder letter will be sent to the patient
regarding the next appointment.

BI-RADS CATEGORY  3: Probably benign.

## 2021-07-23 ENCOUNTER — Encounter: Payer: Self-pay | Admitting: Family Medicine

## 2021-07-23 ENCOUNTER — Ambulatory Visit (LOCAL_COMMUNITY_HEALTH_CENTER): Payer: Medicaid Other | Admitting: Family Medicine

## 2021-07-23 ENCOUNTER — Other Ambulatory Visit: Payer: Self-pay

## 2021-07-23 VITALS — BP 118/79 | Ht 65.25 in | Wt 157.8 lb

## 2021-07-23 DIAGNOSIS — Z3046 Encounter for surveillance of implantable subdermal contraceptive: Secondary | ICD-10-CM

## 2021-07-23 DIAGNOSIS — Z01419 Encounter for gynecological examination (general) (routine) without abnormal findings: Secondary | ICD-10-CM | POA: Diagnosis not present

## 2021-07-23 DIAGNOSIS — Z113 Encounter for screening for infections with a predominantly sexual mode of transmission: Secondary | ICD-10-CM

## 2021-07-23 DIAGNOSIS — Z3009 Encounter for other general counseling and advice on contraception: Secondary | ICD-10-CM

## 2021-07-23 DIAGNOSIS — Z1272 Encounter for screening for malignant neoplasm of vagina: Secondary | ICD-10-CM

## 2021-07-23 NOTE — Progress Notes (Signed)
[facility] DEPARTMENT [facility] [address] Main Number: [phone]    Family Planning Visit- Initial Visit  Subjective:  Amber Thomas is a 35 y.o.  G1P1001   being seen today for an initial annual visit and to discuss reproductive life planning.  The patient is currently using Hormonal Implant for pregnancy prevention. Patient reports   does not want a pregnancy in the next year.  Patient has the following medical conditions does not have any active problems on file.  Chief Complaint  Patient presents with   Annual Exam   Contraception    Patient reports here for physical and nexplanon removal   Patient denies any concerns for STI's    Body mass index is 26.06 kg/m. - Patient is eligible for diabetes screening based on BMI and age >89?  no HA1C ordered? no  Patient reports 1  partner/s in last year. Desires STI screening?  Yes blood work only   Has patient been screened once for HCV in the past?  No  No results found for: HCVAB  Does the patient have current drug use (including MJ), have a partner with drug use, and/or has been incarcerated since last result? No  If yes-- Screen for HCV through [facility]   Does the patient meet criteria for HBV testing? No  Criteria:  -Household, sexual or needle sharing contact with HBV -History of drug use -HIV positive -Those with known Hep C   Health Maintenance Due  Topic Date Due   Hepatitis C Screening  Never done   TETANUS/TDAP  Never done   INFLUENZA VACCINE  Never done    Review of Systems  Constitutional:  Negative for chills, fever, malaise/fatigue and weight loss.  HENT:  Negative for congestion, hearing loss and sore throat.   Eyes:  Negative for blurred vision, double vision and photophobia.  Respiratory:  Negative for shortness of breath.   Cardiovascular:  Negative for chest pain.  Gastrointestinal:  Negative for abdominal pain, blood in stool,  constipation, diarrhea, heartburn, nausea and vomiting.  Genitourinary:  Negative for dysuria and frequency.  Musculoskeletal:  Negative for back pain, joint pain and neck pain.  Skin:  Negative for itching and rash.  Neurological:  Negative for dizziness, weakness and headaches.  Endo/Heme/Allergies:  Does not bruise/bleed easily.  Psychiatric/Behavioral:  Negative for depression, substance abuse and suicidal ideas.    The following portions of the patient's history were reviewed and updated as appropriate: allergies, current medications, past family history, past medical history, past social history, past surgical history and problem list. Problem list updated.   See flowsheet for other program required questions.  Objective:   Vitals:   07/23/21 1337  BP: 118/79  Weight: 157 lb 12.8 oz (71.6 kg)  Height: 5' 5.25" (1.657 m)    Physical Exam Vitals and nursing note reviewed.  Constitutional:      Appearance: Normal appearance.  HENT:     Head: Normocephalic and atraumatic.     Mouth/Throat:     Mouth: Mucous membranes are moist.     Dentition: Normal dentition. No dental caries.     Pharynx: No oropharyngeal exudate or posterior oropharyngeal erythema.  Eyes:     General: No scleral icterus. Neck:     Thyroid: No thyroid mass, thyromegaly or thyroid tenderness.  Cardiovascular:     Rate and Rhythm: Normal rate and regular rhythm.     Pulses: Normal pulses.  Heart sounds: Normal heart sounds.  Pulmonary:     Effort: Pulmonary effort is normal.     Breath sounds: Normal breath sounds.  Chest:  Breasts:    Right: Normal.     Left: Normal.     Comments: Breasts:        Right: Normal. No swelling, mass, nipple discharge, skin change or tenderness.        Left: Normal. No swelling, mass, nipple discharge, skin change or tenderness.   Abdominal:     General: Abdomen is flat. Bowel sounds are normal.     Palpations: Abdomen is soft.  Genitourinary:    General: Normal  vulva.     Rectum: Normal.     Comments: External genitalia without, lice, nits, erythema, edema , lesions or inguinal adenopathy. Vagina with normal mucosa and discharge and pH equals 4.  Cervix without visual lesions, uterus firm, mobile, non-tender, no masses, CMT adnexal fullness or tenderness.   Musculoskeletal:        General: Normal range of motion.     Cervical back: Normal range of motion and neck supple.  Skin:    General: Skin is warm and dry.  Neurological:     General: No focal deficit present.     Mental Status: She is alert and oriented to person, place, and time.  Psychiatric:        Mood and Affect: Mood normal.        Behavior: Behavior normal.      Assessment and Plan:  Amber Thomas is a 35 y.o. female presenting to the [facility] Department for an initial annual wellness/contraceptive visit    1. Smear, vaginal, as part of routine gynecological examination Well woman exam  CBE today  Pap today   - IGP, Aptima HPV  2. Screening examination for venereal disease Pt desires blood work but desires need for vaginal testing.  - HIV [place] [facility] - Syphilis Serology, [place] [facility]  3. Encounter for surveillance of implantable subdermal contraceptive Pt was here for nexplanon removal/ reinsertion. Nexplanon was inserted 2019.  Discussed with patient that nexplanon is now good for 4 years and that if there are no problems, pt can keep device for 1 more year.  Pt desires to keep nexplanon for 1 more year.   4. Family planning counseling Contraception counseling: Reviewed all forms of birth control options in the tiered based approach. available including abstinence; over the counter/barrier methods; hormonal contraceptive medication including pill, patch, ring, injection,contraceptive implant, ECP; hormonal and nonhormonal IUDs; permanent sterilization options including vasectomy and the various tubal sterilization modalities. Risks,  benefits, and typical effectiveness rates were reviewed.  Questions were answered.  Written information was also given to the patient to review.  Patient desires Hormonal Implant, this was prescribed for patient.    The patient will follow up in  1 year for removal and reinsertion.  The patient was told to call with any further questions, or with any concerns about this method of contraception.  Emphasized use of condoms 100% of the time for STI prevention.  Patient was not offered ECP based on current BCM.   Juliene Pina used for Spanish interpretation.     Return in about 1 year (around 07/23/2022) for annual well woman exam.  No future appointments.  Wendi Snipes, FNP

## 2021-07-23 NOTE — Progress Notes (Signed)
Pt.here for PE.

## 2021-07-26 LAB — IGP, APTIMA HPV
HPV Aptima: NEGATIVE
PAP Smear Comment: 0

## 2021-07-28 NOTE — Progress Notes (Signed)
Nexplanon not done today.  See note with same date.    Wendi Snipes, FNP

## 2022-07-30 ENCOUNTER — Telehealth: Payer: Self-pay

## 2022-07-30 ENCOUNTER — Encounter: Payer: Self-pay | Admitting: Nurse Practitioner

## 2022-07-30 ENCOUNTER — Ambulatory Visit: Payer: Self-pay | Admitting: Family Medicine

## 2022-07-30 ENCOUNTER — Telehealth: Payer: Self-pay | Admitting: Licensed Clinical Social Worker

## 2022-07-30 ENCOUNTER — Ambulatory Visit (LOCAL_COMMUNITY_HEALTH_CENTER): Payer: Self-pay | Admitting: Family Medicine

## 2022-07-30 VITALS — BP 113/69 | HR 80 | Temp 97.8°F | Ht 65.25 in | Wt 150.0 lb

## 2022-07-30 DIAGNOSIS — Z3046 Encounter for surveillance of implantable subdermal contraceptive: Secondary | ICD-10-CM

## 2022-07-30 DIAGNOSIS — Z0000 Encounter for general adult medical examination without abnormal findings: Secondary | ICD-10-CM

## 2022-07-30 DIAGNOSIS — Z3009 Encounter for other general counseling and advice on contraception: Secondary | ICD-10-CM

## 2022-07-30 DIAGNOSIS — Z113 Encounter for screening for infections with a predominantly sexual mode of transmission: Secondary | ICD-10-CM

## 2022-07-30 DIAGNOSIS — Z30011 Encounter for initial prescription of contraceptive pills: Secondary | ICD-10-CM

## 2022-07-30 LAB — WET PREP FOR TRICH, YEAST, CLUE
Trichomonas Exam: NEGATIVE
Yeast Exam: NEGATIVE

## 2022-07-30 LAB — HM HIV SCREENING LAB: HM HIV Screening: NEGATIVE

## 2022-07-30 MED ORDER — NORGESTIMATE-ETH ESTRADIOL 0.25-35 MG-MCG PO TABS: 1.0000 | ORAL_TABLET | Freq: Every day | ORAL | 11 refills | Status: DC

## 2022-07-30 NOTE — Progress Notes (Signed)
Referred to 10am encounter.  Martyn Malay, RN

## 2022-07-30 NOTE — Progress Notes (Signed)
[facility] DEPARTMENT [facility] [address] Main Number: [phone]  Family Planning Visit- Repeat Yearly Visit  Subjective:  Amber Thomas is a 35 y.o. G1P1001  being seen today for an annual wellness visit and to discuss contraception options.   The patient is currently using Hormonal Implant for pregnancy prevention. Patient does not want a pregnancy in the next year.    report they are looking for a method that provides High efficacy at preventing pregnancy   Patient has the following medical problems: does not have any active problems on file.  Chief Complaint  Patient presents with   Annual Exam    Patient reports to clinic for Nexplanon removal. States that she wants to switch to OCPs. Patient has no complaints with implant.     See flowsheet for other program required questions.   Body mass index is 24.77 kg/m. - Patient is eligible for diabetes screening based on BMI and age >56?  not applicable HA1C ordered? not applicable  Patient reports 1 of partners in last year. Desires STI screening?  Yes   Has patient been screened once for HCV in the past?  No  No results found for: "HCVAB"  Does the patient have current of drug use, have a partner with drug use, and/or has been incarcerated since last result? No  If yes-- Screen for HCV through [facility]   Does the patient meet criteria for HBV testing? No  Criteria:  -Household, sexual or needle sharing contact with HBV -History of drug use -HIV positive -Those with known Hep C   Health Maintenance Due  Topic Date Due   Hepatitis C Screening  Never done   DTaP/Tdap/Td (1 - Tdap) Never done   INFLUENZA VACCINE  Never done    Review of Systems  Constitutional:  Positive for malaise/fatigue. Negative for fever.  Eyes:  Negative for blurred vision.  Respiratory:  Negative for shortness of breath.   Cardiovascular:  Negative for chest pain.   Gastrointestinal:  Negative for abdominal pain and nausea.  Genitourinary:  Negative for dysuria.  Neurological:  Positive for headaches. Negative for dizziness.  Psychiatric/Behavioral:  Negative for depression.     The following portions of the patient's history were reviewed and updated as appropriate: allergies, current medications, past family history, past medical history, past social history, past surgical history and problem list. Problem list updated.  Objective:   Vitals:   07/30/22 1005  BP: 113/69  Pulse: 80  Temp: 97.8 F (36.6 C)  Weight: 150 lb (68 kg)  Height: 5' 5.25" (1.657 m)    Physical Exam Vitals and nursing note reviewed.  Constitutional:      Appearance: Normal appearance.  HENT:     Head: Normocephalic and atraumatic.     Mouth/Throat:     Mouth: Mucous membranes are moist.     Pharynx: Oropharynx is clear. No oropharyngeal exudate or posterior oropharyngeal erythema.  Pulmonary:     Effort: Pulmonary effort is normal.  Abdominal:     General: Abdomen is flat.     Palpations: There is no mass.     Tenderness: There is no abdominal tenderness. There is no rebound.  Genitourinary:    Comments: Client self swabbed- declined genital exam.  Lymphadenopathy:     Head:     Right side of head: No preauricular or posterior auricular adenopathy.     Left side of head: No preauricular or posterior auricular adenopathy.  Cervical: No cervical adenopathy.     Upper Body:     Right upper body: No supraclavicular, axillary or epitrochlear adenopathy.     Left upper body: No supraclavicular, axillary or epitrochlear adenopathy.  Skin:    General: Skin is warm and dry.     Findings: No rash.  Neurological:     Mental Status: She is alert and oriented to person, place, and time.     Assessment and Plan:  Amber Thomas is a 36 y.o. female G1P1001 presenting to the [facility] Department for an yearly wellness and  contraception visit  Contraception counseling: Reviewed options based on patient desire and reproductive life plan. Patient is interested in Oral Contraceptive. This was provided to the patient today.   Risks, benefits, and typical effectiveness rates were reviewed.  Questions were answered.  Written information was also given to the patient to review.    The patient will follow up in  1 years for surveillance.  The patient was told to call with any further questions, or with any concerns about this method of contraception.  Emphasized use of condoms 100% of the time for STI prevention.  Patient was assessed for need for ECP. Not indicated based on Nexplanon implant.   1. Well woman exam (no gynecological exam) CBE not done- due 2025 PAP not done- due 07/23/2024  Patient complains of occasional headaches. States she is frequently tired. Reviewed that there could be many reasons she feels tired, including thyroid or anemia. Encouraged to get a PCP, who can evaluate and manage this problem for her.   2. Family planning Patient previously on OCPs, cannot remember which one. Given Sprintec.   3. Screening for venereal disease Patient self swabbed- declined genital exam. Patient accepted all screenings including  vaginal CT/GC and bloodwork for HIV/RPR and wet prep. Patient meets criteria for HepB screening? No. Ordered? No - not indicated Patient meets criteria for HepC screening? No. Ordered? No - not indicated.   Treat wet prep per standing order Discussed time line for [facility] results and that patient will be called with positive results and encouraged patient to call if she had not heard in 2 weeks.  Counseled to return or seek care for continued or worsening symptoms Recommended condom use with all sex  - HIV [place] [facility] - Syphilis Serology, [place] [facility] - Chlamydia/Gonorrhea [place] [facility] - WET PREP FOR TRICH, YEAST, CLUE  4. Encounter for Nexplanon removal Patient  identified, informed consent performed, consent signed.   Appropriate time out taken. Nexplanon site identified.  Area prepped in usual sterile fashon. 3 ml of 1% lidocaine with Epinephrine was used to anesthetize the area at the distal end of the implant and along implant site. A small stab incision was made right beside the implant on the distal portion.  The Nexplanon rod was grasped using curved hemostats/manual and removed without difficulty.  There was minimal blood loss. There were no complications.  Steri-strips were applied over the small incision.  A pressure bandage was applied to reduce any bruising.  The patient tolerated the procedure well and was given post procedure instructions.     Nexplanon:   Counseled patient to take OTC analgesic starting as soon as lidocaine starts to wear off and take regularly for at least 48 hr to decrease discomfort.  Specifically to take with food or milk to decrease stomach upset and for IB 600 mg (3 tablets) every 6 hrs; IB 800  mg (4 tablets) every 8 hrs; or Aleve 2 tablets every 12 hrs.     Total time spent 40 minutes.  RTC with concerns.   Lenice Llamas, [place]

## 2022-07-30 NOTE — Addendum Note (Signed)
Addended by: Martyn Malay on: 07/30/2022 03:38 PM   Modules accepted: Orders

## 2022-07-30 NOTE — Telephone Encounter (Signed)
Language line [phone]. Patient lvm, LCSW returned call and spoke with patient about her desire to connect her brother with mental health services. LCSW encouraged patient to have her brother become a patient/set up medical record with [facility] and then call me to schedule.

## 2022-07-30 NOTE — Telephone Encounter (Signed)
Opened in error. Adalynne Steffensmeier, RN  

## 2023-06-11 ENCOUNTER — Ambulatory Visit (LOCAL_COMMUNITY_HEALTH_CENTER): Payer: Self-pay | Admitting: Advanced Practice Midwife

## 2023-06-11 VITALS — BP 108/75 | HR 74 | Ht 65.25 in | Wt 143.6 lb

## 2023-06-11 DIAGNOSIS — B9689 Other specified bacterial agents as the cause of diseases classified elsewhere: Secondary | ICD-10-CM

## 2023-06-11 DIAGNOSIS — N760 Acute vaginitis: Secondary | ICD-10-CM

## 2023-06-11 DIAGNOSIS — Z3009 Encounter for other general counseling and advice on contraception: Secondary | ICD-10-CM

## 2023-06-11 DIAGNOSIS — Z113 Encounter for screening for infections with a predominantly sexual mode of transmission: Secondary | ICD-10-CM

## 2023-06-11 DIAGNOSIS — Z3041 Encounter for surveillance of contraceptive pills: Secondary | ICD-10-CM

## 2023-06-11 LAB — HM HEPATITIS C SCREENING LAB: HM Hepatitis Screen: NEGATIVE

## 2023-06-11 LAB — WET PREP FOR TRICH, YEAST, CLUE
Trichomonas Exam: NEGATIVE
Yeast Exam: NEGATIVE

## 2023-06-11 LAB — HM HIV SCREENING LAB: HM HIV Screening: NEGATIVE

## 2023-06-11 MED ORDER — NORGESTIMATE-ETH ESTRADIOL 0.25-35 MG-MCG PO TABS: 1.0000 | ORAL_TABLET | Freq: Every day | ORAL | Status: DC

## 2023-06-11 MED ORDER — METRONIDAZOLE 500 MG PO TABS: 500.0000 mg | ORAL_TABLET | Freq: Two times a day (BID) | ORAL | Status: AC

## 2023-06-11 NOTE — Progress Notes (Signed)
Wet mount reviewed with provider, patient treated for BV per Lindell Spar, RN

## 2023-06-11 NOTE — Progress Notes (Signed)
   [facility] Problem Visit  [facility]  Subjective:  Amber Thomas is a 37 y.o. SHF nonsmoker G1P1 (37 yo) being seen today for more ocp's and wants STD testing. LMP 05/27/23. Last sex 06/06/23 without condom; with current partner x 2 mo; 1 partner in last 3 mo. Last ETOH 2 years ago (1 [place]). Happy with ocp's but has forgotten to take x2 since began 07/2022. Last PE 07/30/22. Nexplanon removed 07/30/22. Last pap 07/23/21 neg HPV -. On last pack of ocp's.  Chief Complaint  Patient presents with   Contraception    Also desires STI testing.    HPI   Does the patient have a current or past history of drug use? No   No components found for: "HCV"]   Health Maintenance Due  Topic Date Due   Hepatitis C Screening  Never done   DTaP/Tdap/Td (1 - Tdap) Never done   INFLUENZA VACCINE  Never done   COVID-19 Vaccine (1 - 2023-24 season) Never done    ROS  The following portions of the patient's history were reviewed and updated as appropriate: allergies, current medications, past family history, past medical history, past social history, past surgical history and problem list. Problem list updated.   See flowsheet for other program required questions.  Objective:   Vitals:   06/11/23 1525  BP: 108/75  Pulse: 74  Weight: 143 lb 9.6 oz (65.1 kg)  Height: 5' 5.25" (1.657 m)    Physical Exam Vitals and nursing note reviewed.  Constitutional:      Appearance: Normal appearance.  HENT:     Head: Normocephalic.  Pulmonary:     Effort: Pulmonary effort is normal.  Abdominal:     Comments: Soft without masses or tenderness, fair tone  Genitourinary:    General: Normal vulva.     Exam position: Lithotomy position.     Vagina: Vaginal discharge (sl light pink leukorrhea, ph equivocal) present.     Cervix: Normal.     Uterus: Normal.      Adnexa: Right adnexa normal and left adnexa normal.     Rectum: Normal.   Musculoskeletal:        General: Normal range of motion.  Skin:    General: Skin is warm and dry.  Neurological:     Mental Status: She is alert. Mental status is at baseline.       Assessment and Plan:  Amber Thomas is a 37 y.o. female presenting to the [facility] Department for a Women's Health problem visit  1. Family planning  - norgestimate-ethinyl estradiol (ORTHO-CYCLEN) 0.25-35 MG-MCG tablet; Take 1 tablet by mouth daily.  2. Encounter for surveillance of contraceptive pills Needs more ocp's as is on last pack Needs physical 07/31/23  - WET PREP FOR TRICH, YEAST, CLUE  3. Screening examination for venereal disease Treat Wet mount per standing orders Immunization nurse consult  - WET PREP FOR TRICH, YEAST, CLUE - Chlamydia/Gonorrhea [facility] - Syphilis Serology, [place] [facility] - HIV/HCV [place] [facility]     No follow-ups on file.  No future appointments.  Alberteen Spindle, CNM

## 2023-06-11 NOTE — Progress Notes (Addendum)
3 packs Norgestimate and Ethinyl Estradiol Tablets dispensed as per Aliene Altes order 07/2022. Client correctly verbalized to RN how to take and what to do if misses a pill / takes a pill late. Client counseled to call [facility] for physical appt when third pack of ocps opened. Jossie Ng, RN Per call from [place] in [facility] Lab, entry error in lab and needs current wet prep order discontinued and new wet prep order entered. This was done (she states will print order in the lab).  Jossie Ng, RN

## 2023-06-11 NOTE — Addendum Note (Signed)
Addended by: Burt Knack on: 06/11/2023 05:37 PM   Modules accepted: Orders

## 2023-08-03 ENCOUNTER — Encounter: Payer: Self-pay | Admitting: Nurse Practitioner

## 2023-08-03 ENCOUNTER — Ambulatory Visit: Payer: Self-pay | Admitting: Nurse Practitioner

## 2023-08-03 VITALS — BP 123/77 | HR 79 | Ht <= 58 in | Wt 140.4 lb

## 2023-08-03 DIAGNOSIS — Z3009 Encounter for other general counseling and advice on contraception: Secondary | ICD-10-CM

## 2023-08-03 DIAGNOSIS — Z01419 Encounter for gynecological examination (general) (routine) without abnormal findings: Secondary | ICD-10-CM

## 2023-08-03 DIAGNOSIS — Z113 Encounter for screening for infections with a predominantly sexual mode of transmission: Secondary | ICD-10-CM

## 2023-08-03 DIAGNOSIS — Z30011 Encounter for initial prescription of contraceptive pills: Secondary | ICD-10-CM

## 2023-08-03 LAB — HM HIV SCREENING LAB: HM HIV Screening: NEGATIVE

## 2023-08-03 MED ORDER — NORGESTIMATE-ETH ESTRADIOL 0.25-35 MG-MCG PO TABS
1.0000 | ORAL_TABLET | Freq: Every day | ORAL | 13 refills | Status: AC
Start: 2023-08-03 — End: 2024-08-29

## 2023-08-03 NOTE — Progress Notes (Signed)
Pt is here for family planning visit.  Family planning packet reviewed and given to pt.  Wet prep results reviewed, no treatment required per standing orders. Condoms declined. The patient was dispensed sprintec x6 packs today. I provided counseling today regarding the medication. We discussed the medication, the side effects and when to call clinic. Patient given the opportunity to ask questions. Questions answered.    Gaspar Garbe, RN

## 2023-08-03 NOTE — Progress Notes (Signed)
[facility] DEPARTMENT [facility] [address] Main Number: [phone]  Family Planning Visit- Repeat Yearly Visit  Subjective:  Amber Thomas is a 37 y.o. G1P1001  being seen today for an annual wellness visit and to discuss contraception options.   The patient is currently using Oral Contraceptive for pregnancy prevention. Patient  unsure about  wanting a pregnancy in the next year.   They report they are looking for a method that provides Method they can control starting/stopping   Patient has the following medical problems: does not have any active problems on file.  Chief Complaint  Patient presents with   Annual Exam    PE    Patient reports 1 female partner, practicing vaginal sex, with the use of condoms sometimes. Patient denies history or current signs/symptoms of STI. Last sexual encounter 07/30/23 without a condom. Last menstrual period 07/22/23. Patient reports periods monthly and using OCP for contraception at this time after having Nexplanon removed 1 year ago. She indicates rarely missing a pill and the last time was in October, 2 months ago.  PAP history: 07/23/2021-NILM, HPV (-)  See flowsheet for other program required questions.   Body mass index is 240.87 kg/m. - Patient is eligible for diabetes screening based on BMI> 25 and age >35?  no HA1C ordered? not applicable  Patient reports 1 of partners in last year. Desires STI screening?  Yes   Has patient been screened once for HCV in the past?  No  No results found for: "HCVAB"  Does the patient have current of drug use, have a partner with drug use, and/or has been incarcerated since last result? No  If yes-- Screen for HCV through [facility]   Does the patient meet criteria for HBV testing? No  Criteria:  -Household, sexual or needle sharing contact with HBV -History of drug use -HIV positive -Those with known Hep C   Health Maintenance Due   Topic Date Due   DTaP/Tdap/Td (1 - Tdap) Never done   INFLUENZA VACCINE  Never done   COVID-19 Vaccine (1 - 2023-24 season) Never done    Review of Systems  All other systems reviewed and are negative.   The following portions of the patient's history were reviewed and updated as appropriate: allergies, current medications, past family history, past medical history, past social history, past surgical history and problem list. Problem list updated.  Objective:   Vitals:   08/03/23 1017  BP: 123/77  Pulse: 79  Weight: 140 lb 6.4 oz (63.7 kg)  Height: 1' 8.24" (0.514 m)    Physical Exam Vitals and nursing note reviewed. Exam conducted with a chaperone present Roddie Mc Yemen, [place] interpreter present as Biomedical engineer.).  Constitutional:      Appearance: Normal appearance.  HENT:     Head: Normocephalic.     Salivary Glands: Right salivary gland is not diffusely enlarged or tender. Left salivary gland is not diffusely enlarged or tender.     Mouth/Throat:     Lips: Pink. No lesions.     Mouth: Mucous membranes are moist.     Tongue: No lesions. Tongue does not deviate from midline.     Pharynx: Oropharynx is clear. Uvula midline. No oropharyngeal exudate or posterior oropharyngeal erythema.     Tonsils: No tonsillar exudate.  Eyes:     General:        Right eye: No discharge.        Left eye:  No discharge.  Neck:     Thyroid: No thyroid mass or thyroid tenderness.     Trachea: Trachea and phonation normal. No tracheal tenderness or tracheal deviation.  Cardiovascular:     Rate and Rhythm: Normal rate and regular rhythm.     Heart sounds: Normal heart sounds, S1 normal and S2 normal.  Pulmonary:     Effort: Pulmonary effort is normal.     Breath sounds: Normal breath sounds and air entry.  Abdominal:     General: Abdomen is flat. Bowel sounds are normal.     Palpations: Abdomen is soft.     Tenderness: There is no guarding or rebound.  Genitourinary:    General:  Normal vulva.     Exam position: Lithotomy position.     Pubic Area: No rash or pubic lice.      Tanner stage (genital): 5.     Labia:        Right: No rash, tenderness, lesion or injury.        Left: No rash, tenderness, lesion or injury.      Vagina: Normal. No signs of injury and foreign body. No vaginal discharge, erythema, tenderness, bleeding or lesions.     Cervix: Normal. No cervical motion tenderness, discharge, friability, lesion, erythema, cervical bleeding or eversion.     Uterus: Normal.      Adnexa: Right adnexa normal and left adnexa normal.  Lymphadenopathy:     Head:     Right side of head: No submental, submandibular, tonsillar, preauricular or posterior auricular adenopathy.     Left side of head: No submental, submandibular, tonsillar, preauricular or posterior auricular adenopathy.     Cervical: No cervical adenopathy.     Right cervical: No superficial or posterior cervical adenopathy.    Left cervical: No superficial or posterior cervical adenopathy.     Upper Body:     Right upper body: No supraclavicular or axillary adenopathy.     Left upper body: No supraclavicular or axillary adenopathy.     Lower Body: No right inguinal adenopathy. No left inguinal adenopathy.  Skin:    General: Skin is warm and dry.     Comments: Skin tone appropriate for ethnicity.   Neurological:     Mental Status: She is alert and oriented to person, place, and time.  Psychiatric:        Attention and Perception: Attention normal.        Mood and Affect: Mood normal.        Speech: Speech normal.        Behavior: Behavior normal. Behavior is cooperative.        Thought Content: Thought content normal.    Assessment and Plan:  Amber Thomas is a 37 y.o. female G1P1001 presenting to the [facility] Department for an yearly wellness and contraception visit  1. Family planning Contraception counseling: Reviewed options based on patient desire and  reproductive life plan. Patient is interested in Oral Contraceptive. This was provided to the patient today.   Risks, benefits, and typical effectiveness rates were reviewed.  Questions were answered.  Written information was also given to the patient to review.    The patient will follow up in  1 years for surveillance.  The patient was told to call with any further questions, or with any concerns about this method of contraception.  Emphasized use of condoms 100% of the time for STI prevention.  Educated on ECP and assessed need  for ECP. Patient was NOT offered ECP based on no unprotected sex in the past 5 days.   - norgestimate-ethinyl estradiol (ORTHO-CYCLEN) 0.25-35 MG-MCG tablet; Take 1 tablet by mouth daily.  Dispense: 28 tablet; Refill: 13  2. Well woman exam CBE not performed in office today, due 2025. PAP not performed in office today, due 06/2026. Patient encouraged to establish care with PCP and given list of local providers.   3. Screening for venereal disease  - Chlamydia/Gonorrhea [facility] - HIV [place] [facility] - Syphilis Serology, [place] [facility] - WET PREP FOR TRICH, YEAST, CLUE  Return in about 1 year (around 08/02/2024) for Annual well woman exam, OCP refill.  No future appointments.  Due to language barrier, an interpreter Roddie Mc Yemen) was present during the history-taking and subsequent discussion (and for the physical exam) with this patient.   Edmonia James, NP

## 2023-08-04 LAB — WET PREP FOR TRICH, YEAST, CLUE
Trichomonas Exam: NEGATIVE
Yeast Exam: NEGATIVE

## 2024-02-02 ENCOUNTER — Ambulatory Visit: Payer: Self-pay

## 2024-02-02 VITALS — BP 119/80 | Ht 65.25 in | Wt 147.5 lb

## 2024-02-02 DIAGNOSIS — Z3041 Encounter for surveillance of contraceptive pills: Secondary | ICD-10-CM

## 2024-02-02 DIAGNOSIS — Z3009 Encounter for other general counseling and advice on contraception: Secondary | ICD-10-CM

## 2024-02-02 MED ORDER — NORGESTIMATE-ETH ESTRADIOL 0.25-35 MG-MCG PO TABS: 1.0000 | ORAL_TABLET | Freq: Every day | ORAL | Status: AC

## 2024-02-02 NOTE — Progress Notes (Signed)
 In nurse clinic requesting ocp refill (Ortho cyclen). States she is on "last line" of current pack and has one additional pack.  Denies missing any pills and counseled to take ocp same time daily.   Patient was dispensed #6 packs ortho cyclen on 08/03/2023.   The patient was dispensed Ortho Cyclen (Mili) #6 packs today per order by Claryce Cruel, FNP. I provided counseling today regarding the medication. We discussed the medication, the side effects and when to call clinic. Patient given the opportunity to ask questions. Questions answered.    Melven Stable [place], interpreter and [place] interpreter lang line [phone] assisted with visit today. Vani Gunner, RN

## 2024-09-13 ENCOUNTER — Ambulatory Visit: Payer: Self-pay | Admitting: Family Medicine

## 2024-09-13 ENCOUNTER — Ambulatory Visit (LOCAL_COMMUNITY_HEALTH_CENTER): Payer: Self-pay

## 2024-09-13 ENCOUNTER — Ambulatory Visit: Payer: Self-pay

## 2024-09-13 VITALS — BP 123/80 | HR 81 | Ht 62.0 in | Wt 157.2 lb

## 2024-09-13 DIAGNOSIS — Z3009 Encounter for other general counseling and advice on contraception: Secondary | ICD-10-CM

## 2024-09-13 DIAGNOSIS — N939 Abnormal uterine and vaginal bleeding, unspecified: Secondary | ICD-10-CM

## 2024-09-13 DIAGNOSIS — Z3201 Encounter for pregnancy test, result positive: Secondary | ICD-10-CM

## 2024-09-13 DIAGNOSIS — Z131 Encounter for screening for diabetes mellitus: Secondary | ICD-10-CM

## 2024-09-13 DIAGNOSIS — O209 Hemorrhage in early pregnancy, unspecified: Secondary | ICD-10-CM

## 2024-09-13 LAB — PREGNANCY, URINE: Preg Test, Ur: POSITIVE — AB

## 2024-09-13 NOTE — Progress Notes (Signed)
 " [facility] DEPARTMENT [facility] [address] [place] [address] Main phone: [phone]  Family Planning Visit - Repeat Yearly Visit  Subjective:  Amber Thomas is a 39 y.o. G2P1001  being seen today for an annual wellness visit and to discuss preconception counseling. Patient desires a pregnancy  in the next year. Recently stopped her OCPs.  Patient has the following medical problems:  There are no active problems to display for this patient.  Chief Complaint  Patient presents with   Annual Exam    PE   HPI Patient reports desire for pregnancy. Had one prior pregnancy/vaginal delivery in 2014. Recently discontinued her OCPs - in November, 2025. Is taking a prenatal vitamin.  Had a period 11/25, 12/29, and has been bleeding on/off since. Bleeding is a coffee/black color. Denies pain. No pain with sex. Did not have problems with bleeding/spotting on her pills - was on the OCPs for many years. Today, her bleeding is very little. She feels it is getting somewhat better. Wearing a pantyliner daily.  Last pap was 2022 and NILM. Due November, 2027.  No vaginal symptoms or concerns. No breast concerns. Accepts breast and pelvic exam today.  Patient denies other concerns.   Review of Systems  All other systems reviewed and are negative.  See flowsheet for further details and programmatic requirements Hyperlink available at the top of the signed note in blue.  Flow sheet content below:  Pregnancy Intention Screening Does the patient want to become pregnant in the next year?: Yes Does the patient's partner want to become pregnant in the next year?: N/A Would the patient like to discuss contraceptive options today?: No Sexual History What age did you start your period?: 14 How often do you have your period?: irregular Date of last sex?: 09/10/24 Has the patient had unprotected sex within the last 5 days?: Yes Do you  have sex with men, women, both men and women?: Men only In the past 2 months how many partners have you had sex with?: 1 In the past 12 months, how many partners have you had sex with?: 1 Is it possible that any of your sex partners in the past 12 months had sex with someone else whild they were still in a sexual relationship with you?: No What ways do you have sex?: Vaginal Do you or your partner use condoms and/or dental dams every time you have vaginal, oral or anal sex?: Sometimes Do you douche?: No Date of last HIV test?: 08/03/23 Have you ever had an STD?: Yes Have any of your partners had an STD?: Yes Partner Previous STD?: Chlamydia Date?:  (pt report 10 years ago) Have you or your partner ever shot up drugs?: No Have any of your partners used drugs in the past?: No Have you or your partners exchanged money or drugs for sex?: No  Diabetes screening This patient is 39 y.o. with a BMI of Body mass index is 28.75 kg/m.[name]  Is patient eligible for diabetes screening (age >35 and BMI >25)?  yes  Was Hgb A1c ordered? yes  STI screening Patient reports 1 of partners in last year.  Does this patient desire STI screening?  No - declines  Cervical Cancer Screening  Repeat in November, 2027 Result Date Procedure Results Follow-ups  07/23/2021 IGP, Aptima HPV DIAGNOSIS:: Comment Specimen adequacy:: Comment Clinician Provided ICD10: Comment Performed by:: Comment PAP Smear Comment: . Note:: Comment Test Methodology: Comment HPV Aptima: Negative  09/29/2012 Cytology - PAP      Health Maintenance Due  Topic Date Due   DTaP/Tdap/Td (1 - Tdap) Never done   Hepatitis B Vaccines 19-59 Average Risk (1 of 3 - 19+ 3-dose series) Never done   Influenza Vaccine  Never done   COVID-19 Vaccine (1 - 2025-26 season) Never done   The following portions of the patient's history were reviewed and updated as appropriate: allergies, current medications, past family history, past medical history,  past social history, past surgical history and problem list. Problem list updated.  Objective:   Vitals:   09/13/24 1353  BP: 123/80  Pulse: 81  Weight: 157 lb 3.2 oz (71.3 kg)  Height: 5' 2 (1.575 m)   Physical Exam Vitals and nursing note reviewed. Exam conducted with a chaperone present Brett Orange).  Constitutional:      Appearance: Normal appearance.  HENT:     Head: Normocephalic and atraumatic.     Comments: No nits or hair loss of scalp, brows, and lashes    Mouth/Throat:     Mouth: Mucous membranes are moist.     Pharynx: Oropharynx is clear. No oropharyngeal exudate or posterior oropharyngeal erythema.  Eyes:     General:        Right eye: No discharge.        Left eye: No discharge.     Conjunctiva/sclera: Conjunctivae normal.     Right eye: Right conjunctiva is not injected.     Left eye: Left conjunctiva is not injected.  Cardiovascular:     Heart sounds: Normal heart sounds, S1 normal and S2 normal.  Pulmonary:     Effort: Pulmonary effort is normal.     Breath sounds: Normal breath sounds.  Chest:  Breasts:    Right: Normal.     Left: Normal.  Abdominal:     Tenderness: There is no abdominal tenderness. There is no rebound.  Genitourinary:    General: Normal vulva.     Exam position: Lithotomy position.     Pubic Area: No rash or pubic lice.      Labia:        Right: No rash or lesion.        Left: No rash or lesion.      Vagina: Normal. No vaginal discharge, erythema or lesions.     Cervix: No cervical motion tenderness, discharge, lesion or erythema.  Musculoskeletal:     Right lower leg: No edema.     Left lower leg: No edema.  Lymphadenopathy:     Cervical: No cervical adenopathy.     Upper Body:     Right upper body: No supraclavicular adenopathy.     Left upper body: No supraclavicular adenopathy.  Skin:    General: Skin is warm and dry.     Findings: No lesion or rash.  Neurological:     Mental Status: She is alert and oriented to  person, place, and time.    Assessment and Plan:  Amber Thomas is a 39 y.o. female G2P1001 presenting to the [facility] Department for an yearly wellness and contraception visit  Family planning  Contraception counseling:  Reviewed options based on patient desire and reproductive life plan. Patient is interested in Pregnant/Seeking Pregnancy.   2. Screening for diabetes mellitus  - Hgb A1c w/o eAG  3. Vaginal bleeding  - Pregnancy, urine positive - US  OB LESS THAN 14 WEEKS WITH OB TRANSVAGINAL; Future  4. Bleeding in early  pregnancy  - Beta hCG quant (ref lab)  - Discussed to go to ED if bleeding becomes heavy, or if she develops any lower abdominal pain. Discussed signs of ectopic pregnancy. - Taking prenatal vitamin already and if pregnancy is viable, will have her prenatal care here at [facility] - Pregnancy packet provided and US  ordered (scheduled for 09/16/24)  Due to language barrier, a Spanish interpreter Kemp T.) was present in person during the history-taking, subsequent discussion, and physical exam with this patient.    Return in about 4 weeks (around 10/11/2024).  Future Appointments  Date Time Provider Department Center  09/16/2024  1:45 PM [facility]  [facility]  [facility]Outpati  10/04/2024  2:20 PM [facility] PROVIDER [facility] None   Damien FORBES Satchel, NP "

## 2024-09-13 NOTE — Progress Notes (Signed)
 Pt is is here for PE. UPT: positive. Pt has been given the resources for positive pregnancy. Opportunity given to pt to ask questions for any clarification. Questions answered. Wilkie Drought, RN.

## 2024-09-14 LAB — BETA HCG QUANT (REF LAB): hCG Quant: 116 m[IU]/mL

## 2024-09-14 LAB — HGB A1C W/O EAG: Hgb A1c MFr Bld: 5.2 % (ref 4.8–5.6)

## 2024-09-16 ENCOUNTER — Ambulatory Visit: Admission: RE | Admit: 2024-09-16 | Discharge: 2024-09-16 | Disposition: A | Payer: Self-pay | Source: Ambulatory Visit

## 2024-09-16 DIAGNOSIS — N939 Abnormal uterine and vaginal bleeding, unspecified: Secondary | ICD-10-CM | POA: Insufficient documentation

## 2024-09-20 ENCOUNTER — Other Ambulatory Visit: Payer: Self-pay

## 2024-09-20 ENCOUNTER — Ambulatory Visit: Payer: Self-pay

## 2024-09-20 DIAGNOSIS — O3680X0 Pregnancy with inconclusive fetal viability, not applicable or unspecified: Secondary | ICD-10-CM

## 2024-09-20 NOTE — Progress Notes (Signed)
 Ultrasound reviewed.  Called client with Spanish interpreter. She states has not been given any results regarding her US  yet.   Informed her she does not have a pregnancy in her uterus. Discussed she may have had a miscarriage, but that we need to ensure she does not have a pregnancy outside of the uterus. Left side complex cystic lesion - 2.2 cm with peripheral hypervascularity and smaller adjacent paraovarian cyst - 1.8 cm. Right ovary not visualized due to overlying bowel gas. At her appt on 1/20, she did not complain of any pelvic pain or discomfort and had no pain on exam.  Follow up beta hcg and US  ordered for pregnancy of unknown location. Maternity nurse to schedule and inform client of appts. US  scheduled for 09/28/24.  Reviewed bleeding/pain precautions with client. She denies any bleeding and has had no pain. Discussed reasons to go to ED.  Damien Satchel [facility]

## 2024-09-20 NOTE — Progress Notes (Signed)
 Client here today for repeat beta hcg only - lab visit.  Amber Thomas [facility]

## 2024-09-21 ENCOUNTER — Ambulatory Visit: Payer: Self-pay

## 2024-09-21 ENCOUNTER — Telehealth: Payer: Self-pay

## 2024-09-21 DIAGNOSIS — O3680X0 Pregnancy with inconclusive fetal viability, not applicable or unspecified: Secondary | ICD-10-CM

## 2024-09-21 LAB — BETA HCG QUANT (REF LAB): hCG Quant: 139 m[IU]/mL

## 2024-09-21 NOTE — Progress Notes (Signed)
 Reviewed. Called and spoke with client and informed her of result, need for evaluation by ob/gyn. Urgent referral sent to [facility]. Client denies pain or bleeding today. Informed her of referral, US  on 2/4 and to expect call to schedule consult. Provided with her [facility] phone number in case she does not hear from them. Reviewed precautions and reasons to go to ED for emergent care. Damien Satchel NP

## 2024-09-21 NOTE — Telephone Encounter (Signed)
 1. Pregnancy of unknown anatomic location (Primary)  - Beta hcg has risen from 116 (1/20) - 139 (1/27) - Client denied pain or bleeding yesterday when she presented for blood draw - Ambulatory referral to Obstetrics / Gynecology (urgent)  Attempted to call client with Spanish interpreter to relay these results and to let her know she will be referred to gynecology for further work up. She did not answer, voicemail left to callback [facility].   Damien Satchel [facility]

## 2024-09-22 ENCOUNTER — Other Ambulatory Visit: Payer: Self-pay

## 2024-09-28 ENCOUNTER — Ambulatory Visit: Admission: RE | Admit: 2024-09-28 | Discharge: 2024-09-28 | Payer: Self-pay

## 2024-09-28 DIAGNOSIS — O3680X0 Pregnancy with inconclusive fetal viability, not applicable or unspecified: Secondary | ICD-10-CM

## 2024-09-29 ENCOUNTER — Other Ambulatory Visit: Payer: Self-pay

## 2024-09-29 ENCOUNTER — Telehealth: Payer: Self-pay

## 2024-09-29 DIAGNOSIS — O3680X0 Pregnancy with inconclusive fetal viability, not applicable or unspecified: Secondary | ICD-10-CM

## 2024-09-29 DIAGNOSIS — O200 Threatened abortion: Secondary | ICD-10-CM

## 2024-09-29 NOTE — Telephone Encounter (Signed)
 Spoke to client today with Spanish interpreter ID [phone].  She has a follow up with [facility] on 10/05/24 for pregnancy of unknown location. Discussed with client recommendation for one more serial beta hcg prior to this appt. She states she can come today. Front desk staff member to call with appt slot. She did have repeat TV US  yesterday and results not yet available. She reports they did not give her any results yesterday.  She is feeling well without pain or bleeding.  Damien Satchel NP

## 2024-09-29 NOTE — Progress Notes (Signed)
 In nurse clinic for beta hcG. Walked to lab. Advised pt would receive call regarding results.

## 2024-09-30 ENCOUNTER — Ambulatory Visit: Payer: Self-pay

## 2024-09-30 LAB — BETA HCG QUANT (REF LAB): hCG Quant: 103 m[IU]/mL

## 2024-09-30 NOTE — Progress Notes (Signed)
 US  reviewed. No intrauterine or ectopic pregnancy seen on US . Beta hcg trended downward yesterday from 139 to 103. Hadassah has a consult with [facility] on 10/05/24. As of check in yesterday, no vaginal bleeding or pain.   Damien Satchel NP

## 2024-10-05 ENCOUNTER — Encounter: Payer: Self-pay | Admitting: Registered Nurse
